# Patient Record
Sex: Female | Born: 1955 | Race: White | Hispanic: No | Marital: Single | Smoking: Never smoker
Health system: Southern US, Community
[De-identification: ages and names within clinical notes are randomized; demographics above are authoritative.]

## PROBLEM LIST (undated history)

## (undated) DIAGNOSIS — F329 Major depressive disorder, single episode, unspecified: Secondary | ICD-10-CM

## (undated) DIAGNOSIS — K5900 Constipation, unspecified: Secondary | ICD-10-CM

## (undated) DIAGNOSIS — I2510 Atherosclerotic heart disease of native coronary artery without angina pectoris: Secondary | ICD-10-CM

## (undated) DIAGNOSIS — F32A Depression, unspecified: Secondary | ICD-10-CM

## (undated) DIAGNOSIS — E785 Hyperlipidemia, unspecified: Secondary | ICD-10-CM

## (undated) HISTORY — PX: COLONOSCOPY: SHX174

## (undated) HISTORY — PX: BREAST BIOPSY: SHX20

---

## 2016-11-15 ENCOUNTER — Emergency Department (HOSPITAL_COMMUNITY)
Admission: EM | Admit: 2016-11-15 | Discharge: 2016-11-15 | Disposition: A | Discharge status: Home / Self Care | Payer: PRIVATE HEALTH INSURANCE | Attending: Emergency Medicine | Admitting: Emergency Medicine

## 2016-11-15 ENCOUNTER — Emergency Department (HOSPITAL_COMMUNITY): Payer: PRIVATE HEALTH INSURANCE

## 2016-11-15 ENCOUNTER — Encounter (HOSPITAL_COMMUNITY): Payer: Self-pay | Admitting: Emergency Medicine

## 2016-11-15 DIAGNOSIS — R0789 Other chest pain: Secondary | ICD-10-CM | POA: Diagnosis not present

## 2016-11-15 DIAGNOSIS — Z7982 Long term (current) use of aspirin: Secondary | ICD-10-CM | POA: Diagnosis not present

## 2016-11-15 DIAGNOSIS — R079 Chest pain, unspecified: Secondary | ICD-10-CM | POA: Diagnosis present

## 2016-11-15 DIAGNOSIS — I2510 Atherosclerotic heart disease of native coronary artery without angina pectoris: Secondary | ICD-10-CM | POA: Diagnosis not present

## 2016-11-15 HISTORY — DX: Depression, unspecified: F32.A

## 2016-11-15 HISTORY — DX: Major depressive disorder, single episode, unspecified: F32.9

## 2016-11-15 HISTORY — DX: Atherosclerotic heart disease of native coronary artery without angina pectoris: I25.10

## 2016-11-15 LAB — BASIC METABOLIC PANEL
Anion gap: 9 (ref 5–15)
BUN: 18 mg/dL (ref 6–20)
CO2: 24 mmol/L (ref 22–32)
CREATININE: 0.97 mg/dL (ref 0.44–1.00)
Calcium: 9.6 mg/dL (ref 8.9–10.3)
Chloride: 106 mmol/L (ref 101–111)
GFR calc Af Amer: >60 mL/min (ref >60)
GLUCOSE: 101 mg/dL — AB (ref 65–99)
Potassium: 3.7 mmol/L (ref 3.5–5.1)
SODIUM: 139 mmol/L (ref 135–145)

## 2016-11-15 LAB — CBC
HCT: 42.9 % (ref 36.0–46.0)
Hemoglobin: 14.4 g/dL (ref 12.0–15.0)
MCH: 31.4 pg (ref 26.0–34.0)
MCHC: 33.6 g/dL (ref 30.0–36.0)
MCV: 93.5 fL (ref 78.0–100.0)
PLATELETS: 241 10*3/uL (ref 150–400)
RBC: 4.59 MIL/uL (ref 3.87–5.11)
RDW: 12.6 % (ref 11.5–15.5)
WBC: 7 10*3/uL (ref 4.0–10.5)

## 2016-11-15 LAB — D-DIMER, QUANTITATIVE: D-Dimer, Quant: <0.27 ug/mL-FEU (ref 0.00–0.50)

## 2016-11-15 LAB — TROPONIN I

## 2016-11-15 LAB — I-STAT TROPONIN, ED: Troponin i, poc: 0 ng/mL (ref 0.00–0.08)

## 2016-11-15 MED ORDER — GI COCKTAIL ~~LOC~~
30.0000 mL | Freq: Once | ORAL | Status: DC
  Filled 2016-11-15: qty 30

## 2016-11-15 NOTE — ED Notes (Signed)
Pt ambulated to room from waiting room, tolerated well. 

## 2016-11-15 NOTE — ED Triage Notes (Signed)
Cp started yesterday feels like heaviness she states hurts to take a deep breath

## 2016-11-15 NOTE — ED Provider Notes (Signed)
[facility]-EMERGENCY DEPT Provider Note   CSN: [id number] Arrival date & time: 11/15/16  1345     History   Chief Complaint Chief Complaint  Patient presents with  . Chest Pain    HPI Cheryl Dunlap is a 61 y.o. female.  [age] yoF with a chief complaint of left-sided chest pain. This started when she woke up this morning. Has persisted throughout the day. Normally worse with normal activity throughout the day. Some mild shortness of breath with this. Denies diaphoresis denies vomiting. Having some radiation to her left jaw and her shoulder. Mild nausea as well as. Denies smoking denies diabetes denies hypertension. Has a history of hyperlipidemia as well as mom had an MI in her early 5950s. Denies lower extremity edema. Has been making multiple trips up to [place] over the past couple months. Return after seven-hour drive yesterday.   The history is provided by the patient.  Chest Pain   This is a new problem. The current episode started yesterday. The problem occurs constantly. The problem has not changed since onset.The pain is associated with movement and walking. The pain is present in the lateral region. The pain is at a severity of 8/10. The pain is moderate. The quality of the pain is described as pressure-like. The pain radiates to the left jaw and left arm. Duration of episode(s) is 1 day. Associated symptoms include malaise/fatigue, shortness of breath and weakness. Pertinent negatives include no dizziness, no fever, no headaches, no nausea, no palpitations and no vomiting. She has tried nothing for the symptoms. The treatment provided no relief.  Her past medical history is significant for hyperlipidemia.  Pertinent negatives for past medical history include no CAD, no hypertension, no MI, no PE and no PVD.  Her family medical history is significant for early MI.  Pertinent negatives for family medical history include: no PE.  Procedure history is negative for cardiac catheterization.     Past Medical History:  Diagnosis Date  . Coronary artery disease    corodid disection  . Depression     There are no active problems to display for this patient.   History reviewed. No pertinent surgical history.  OB History    No data available       Home Medications    Prior to Admission medications   Medication Sig Start Date End Date Taking? Authorizing Provider  aspirin EC 81 MG tablet Take 81 mg by mouth at bedtime.    Yes Historical Provider, MD  Bioflavonoid Products (GRAPE SEED PO) Take 1 capsule by mouth at bedtime.   Yes Historical Provider, MD  glucosamine-chondroitin 500-400 MG tablet Take 1 tablet by mouth at bedtime.   Yes Historical Provider, MD  Multiple Vitamins-Minerals (ONE-A-DAY WOMENS 50+ ADVANTAGE) TABS Take 1 tablet by mouth daily.   Yes Historical Provider, MD  Omega-3 Fatty Acids (FISH OIL PO) Take 1 capsule by mouth daily.   Yes Historical Provider, MD  simvastatin (ZOCOR) 10 MG tablet Take 15 mg by mouth at bedtime.   Yes Historical Provider, MD  UBIQUINOL PO Take 1 capsule by mouth daily.   Yes Historical Provider, MD    Family History No family history on file.  Social History Social History  Substance Use Topics  . Smoking status: Never Smoker  . Smokeless tobacco: Never Used  . Alcohol use Not on file     Allergies   Lincomycin and Eryc [erythromycin]   Review of Systems Review of Systems  Constitutional: Positive for malaise/fatigue. Negative  for chills and fever.  HENT: Negative for congestion and rhinorrhea.   Eyes: Negative for redness and visual disturbance.  Respiratory: Positive for shortness of breath. Negative for wheezing.   Cardiovascular: Positive for chest pain. Negative for palpitations.  Gastrointestinal: Negative for nausea and vomiting.  Genitourinary: Negative for dysuria and urgency.  Musculoskeletal: Negative for arthralgias and myalgias.  Skin: Negative for pallor and wound.  Neurological: Positive  for weakness. Negative for dizziness and headaches.     Physical Exam Updated Vital Signs BP 139/79   Pulse (!) 56   Temp 98.7 F (37.1 C) (Oral)   Resp 19   SpO2 100%   Physical Exam  Constitutional: She is oriented to person, place, and time. She appears well-developed and well-nourished. No distress.  HENT:  Head: Normocephalic and atraumatic.  Eyes: EOM are normal. Pupils are equal, round, and reactive to light.  Neck: Normal range of motion. Neck supple.  Cardiovascular: Normal rate and regular rhythm.  Exam reveals no gallop and no friction rub.   No murmur heard. Pulmonary/Chest: Effort normal. She has no wheezes. She has no rales.  Abdominal: Soft. She exhibits no distension and no mass. There is no tenderness. There is no guarding.  Musculoskeletal: She exhibits no edema or tenderness.  Neurological: She is alert and oriented to person, place, and time.  Skin: Skin is warm and dry. She is not diaphoretic.  Psychiatric: She has a normal mood and affect. Her behavior is normal.  Nursing note and vitals reviewed.    ED Treatments / Results  Labs (all labs ordered are listed, but only abnormal results are displayed) Labs Reviewed  BASIC METABOLIC PANEL - Abnormal; Notable for the following:       Result Value   Glucose, Bld 101 (*)    All other components within normal limits  CBC  TROPONIN I  D-DIMER, QUANTITATIVE (NOT AT [facility])  I-STAT TROPOININ, ED  I-STAT TROPOININ, ED    EKG  EKG Interpretation  Date/Time:  Wednesday November 15 2016 13:59:34 EST Ventricular Rate:  70 PR Interval:  200 QRS Duration: 94 QT Interval:  402 QTC Calculation: 434 R Axis:   131 Text Interpretation:  Normal sinus rhythm with sinus arrhythmia Right axis deviation Pulmonary disease pattern Abnormal ECG No old tracing to compare Confirmed by Cedar Ditullio MD, DANIEL [phone]) on 11/15/2016 3:49:23 PM       Radiology Dg Chest 2 View  Result Date: 11/15/2016 CLINICAL DATA:  Episodes  of chest heaviness and left-sided chest pain for the past month. Pain radiates seen the left jaw. No known cardiopulmonary history. Questionable history of previous carotid dissection. EXAM: CHEST  2 VIEW COMPARISON:  None in PACs FINDINGS: The lungs are well-expanded. There is no focal infiltrate. There is no pleural effusion. The heart and pulmonary vascularity are normal. The mediastinum is normal in width. In the retrosternal region there is a 3 mm diameter calcified nodule that likely reflects previous granulomatous infection. The observed bony thorax exhibits no acute abnormality. IMPRESSION: No acute cardiopulmonary disease. Electronically Signed   By: David  [place] M.D.   On: 11/15/2016 15:08    Procedures Procedures (including critical care time)  Medications Ordered in ED Medications - No data to display   Initial Impression / Assessment and Plan / ED Course  I have reviewed the triage vital signs and the nursing notes.  Pertinent labs & imaging results that were available during my care of the patient were reviewed by me and considered in  my medical decision making (see chart for details).     61 yo F With a chief complaint of chest pain. Does not sound exertional based on her history. Will obtain a delta troponin. D-dimer with recent prolonged travel.  Delta trop negative, ddimer negative.  D/c home.   11:27 PM:  I have discussed the diagnosis/risks/treatment options with the patient and family and believe the pt to be eligible for discharge home to follow-up with PCP. We also discussed returning to the ED immediately if new or worsening sx occur. We discussed the sx which are most concerning (e.g., sudden worsening pain, fever, inability to tolerate by mouth) that necessitate immediate return. Medications administered to the patient during their visit and any new prescriptions provided to the patient are listed below.  Medications given during this visit Medications - No data to  display   The patient appears reasonably screen and/or stabilized for discharge and I doubt any other medical condition or other [facility] requiring further screening, evaluation, or treatment in the ED at this time prior to discharge.    Final Clinical Impressions(s) / ED Diagnoses   Final diagnoses:  Atypical chest pain    New Prescriptions Discharge Medication List as of 11/15/2016  6:58 PM       Melene Plan, DO 11/15/16 2327

## 2016-11-15 NOTE — Discharge Instructions (Signed)
Follow up with a family doc.  Follow up with your cardiologist.  Call them and discuss your symptoms.  They may want to see you sooner, or have a stress test ordered for you. Return for worsening symptoms.

## 2016-11-16 NOTE — Progress Notes (Signed)
Nsg Discharge Note  Admit Date:  11/15/2016 Discharge date: 11/16/2016   Sherriann Godina to be D/C'd Home per MD order.  AVS completed.  Copy for chart, and copy for patient signed, and dated. Patient/caregiver able to verbalize understanding.  Discharge Medication: Allergies as of 11/15/2016      Reactions   Lincomycin Nausea And Vomiting   Eryc [erythromycin] Hives      Medication List    ASK your doctor about these medications   aspirin EC 81 MG tablet Take 81 mg by mouth at bedtime.   FISH OIL PO Take 1 capsule by mouth daily.   glucosamine-chondroitin 500-400 MG tablet Take 1 tablet by mouth at bedtime.   GRAPE SEED PO Take 1 capsule by mouth at bedtime.   ONE-A-DAY WOMENS 50+ ADVANTAGE Tabs Take 1 tablet by mouth daily.   simvastatin 10 MG tablet Commonly known as:  ZOCOR Take 15 mg by mouth at bedtime.   UBIQUINOL PO Take 1 capsule by mouth daily.       Discharge Assessment: Vitals:   11/15/16 1556 11/15/16 1900  BP: 146/82 139/79  Pulse: 60 (!) 56  Resp: 14 19  Temp:     Skin clean, dry and intact without evidence of skin break down, no evidence of skin tears noted. IV catheter discontinued intact. Site without signs and symptoms of complications - no redness or edema noted at insertion site, patient denies c/o pain - only slight tenderness at site.  Dressing with slight pressure applied.  D/c Instructions-Education: Discharge instructions given to patient/family with verbalized understanding. D/c education completed with patient/family including follow up instructions, medication list, d/c activities limitations if indicated, with other d/c instructions as indicated by MD - patient able to verbalize understanding, all questions fully answered. Patient instructed to return to ED, call 911, or call MD for any changes in condition.  Patient escorted via WC, and D/C home via private auto.  Chandi Nicklin Consuella Loselaine, RN 11/16/2016 7:45 PM

## 2016-11-22 ENCOUNTER — Ambulatory Visit: Payer: PRIVATE HEALTH INSURANCE | Admitting: Primary Care

## 2017-02-16 ENCOUNTER — Other Ambulatory Visit: Payer: Self-pay | Admitting: Student

## 2017-02-16 DIAGNOSIS — Z1239 Encounter for other screening for malignant neoplasm of breast: Secondary | ICD-10-CM

## 2017-05-08 ENCOUNTER — Ambulatory Visit
Admission: RE | Admit: 2017-05-08 | Discharge: 2017-05-08 | Disposition: A | Discharge status: Home / Self Care | Payer: PRIVATE HEALTH INSURANCE | Source: Ambulatory Visit | Attending: Student | Admitting: Student

## 2017-05-08 DIAGNOSIS — Z1231 Encounter for screening mammogram for malignant neoplasm of breast: Secondary | ICD-10-CM | POA: Insufficient documentation

## 2017-05-08 DIAGNOSIS — Z1239 Encounter for other screening for malignant neoplasm of breast: Secondary | ICD-10-CM

## 2018-06-18 ENCOUNTER — Other Ambulatory Visit: Payer: Self-pay | Admitting: Student

## 2018-06-18 DIAGNOSIS — Z1231 Encounter for screening mammogram for malignant neoplasm of breast: Secondary | ICD-10-CM

## 2018-07-11 ENCOUNTER — Ambulatory Visit
Admission: RE | Admit: 2018-07-11 | Discharge: 2018-07-11 | Disposition: A | Discharge status: Home / Self Care | Payer: 59 | Source: Ambulatory Visit | Attending: Student | Admitting: Student

## 2018-07-11 DIAGNOSIS — Z1231 Encounter for screening mammogram for malignant neoplasm of breast: Secondary | ICD-10-CM | POA: Diagnosis present

## 2019-05-28 ENCOUNTER — Other Ambulatory Visit: Payer: Self-pay | Admitting: Student

## 2019-05-28 DIAGNOSIS — Z1231 Encounter for screening mammogram for malignant neoplasm of breast: Secondary | ICD-10-CM

## 2019-07-17 ENCOUNTER — Ambulatory Visit
Admission: RE | Admit: 2019-07-17 | Discharge: 2019-07-17 | Disposition: A | Discharge status: Home / Self Care | Payer: 59 | Source: Ambulatory Visit | Attending: Student | Admitting: Student

## 2019-07-17 DIAGNOSIS — Z1231 Encounter for screening mammogram for malignant neoplasm of breast: Secondary | ICD-10-CM | POA: Diagnosis present

## 2020-04-02 ENCOUNTER — Other Ambulatory Visit: Payer: Self-pay

## 2020-04-02 ENCOUNTER — Emergency Department: Payer: 59

## 2020-04-02 DIAGNOSIS — I2510 Atherosclerotic heart disease of native coronary artery without angina pectoris: Secondary | ICD-10-CM | POA: Diagnosis not present

## 2020-04-02 DIAGNOSIS — I10 Essential (primary) hypertension: Secondary | ICD-10-CM | POA: Insufficient documentation

## 2020-04-02 DIAGNOSIS — Z7982 Long term (current) use of aspirin: Secondary | ICD-10-CM | POA: Insufficient documentation

## 2020-04-02 DIAGNOSIS — R0789 Other chest pain: Secondary | ICD-10-CM | POA: Diagnosis present

## 2020-04-02 LAB — BASIC METABOLIC PANEL
Anion gap: 10 (ref 5–15)
BUN: 22 mg/dL (ref 8–23)
CO2: 24 mmol/L (ref 22–32)
Calcium: 9.4 mg/dL (ref 8.9–10.3)
Chloride: 103 mmol/L (ref 98–111)
Creatinine, Ser: 0.86 mg/dL (ref 0.44–1.00)
GFR calc Af Amer: >60 mL/min (ref >60)
GFR calc non Af Amer: >60 mL/min (ref >60)
Glucose, Bld: 105 mg/dL — ABNORMAL HIGH (ref 70–99)
Potassium: 3.4 mmol/L — ABNORMAL LOW (ref 3.5–5.1)
Sodium: 137 mmol/L (ref 135–145)

## 2020-04-02 LAB — CBC
HCT: 44.3 % (ref 36.0–46.0)
Hemoglobin: 15.3 g/dL — ABNORMAL HIGH (ref 12.0–15.0)
MCH: 31 pg (ref 26.0–34.0)
MCHC: 34.5 g/dL (ref 30.0–36.0)
MCV: 89.9 fL (ref 80.0–100.0)
Platelets: 277 10*3/uL (ref 150–400)
RBC: 4.93 MIL/uL (ref 3.87–5.11)
RDW: 12.6 % (ref 11.5–15.5)
WBC: 8.8 10*3/uL (ref 4.0–10.5)
nRBC: 0 % (ref 0.0–0.2)

## 2020-04-02 LAB — TROPONIN I (HIGH SENSITIVITY): Troponin I (High Sensitivity): 5 ng/L (ref <18)

## 2020-04-02 NOTE — ED Triage Notes (Signed)
Patient c/o medial chest pain radiating into neck and jaw beginning tonight. Patient c/o headache beginning today.

## 2020-04-03 ENCOUNTER — Emergency Department
Admission: EM | Admit: 2020-04-03 | Discharge: 2020-04-03 | Disposition: A | Discharge status: Home / Self Care | Payer: 59 | Attending: Emergency Medicine | Admitting: Emergency Medicine

## 2020-04-03 ENCOUNTER — Encounter: Payer: Self-pay | Admitting: Emergency Medicine

## 2020-04-03 DIAGNOSIS — R0789 Other chest pain: Secondary | ICD-10-CM

## 2020-04-03 DIAGNOSIS — I10 Essential (primary) hypertension: Secondary | ICD-10-CM

## 2020-04-03 HISTORY — DX: Hyperlipidemia, unspecified: E78.5

## 2020-04-03 LAB — TROPONIN I (HIGH SENSITIVITY): Troponin I (High Sensitivity): 4 ng/L (ref <18)

## 2020-04-03 NOTE — Discharge Instructions (Addendum)
You have been seen in the Emergency Department (ED) today for chest pressure.  As we have discussed today's test results are normal, but you may require further testing.  You will also need to follow-up as an outpatient to determine if in fact you do have persistent hypertension and would benefit from treatment, or if your elevated blood pressure tonight is more results of the symptoms and anxiety (rather than the symptoms and anxiety being a result of the blood pressure).  Please follow up with the recommended doctor as instructed above in these documents regarding today's emergent visit and your recent symptoms to discuss further management.  Continue to take your regular medications. If you are not doing so already, please also take a daily baby aspirin (81 mg), at least until you follow up with your doctor.  Return to the Emergency Department (ED) if you experience any further chest pain/pressure/tightness, difficulty breathing, or sudden sweating, or other symptoms that concern you.

## 2020-04-03 NOTE — ED Provider Notes (Signed)
[facility] Emergency Department Provider Note  ____________________________________________   First MD Initiated Contact with Patient 04/03/20 [phone]     (approximate)  I have reviewed the triage vital signs and the nursing notes.   HISTORY  Chief Complaint Chest Pain    HPI Cheryl Dunlap is a 64 y.o. female with medical history as listed below who presents for evaluation of chest pressure.  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  She says she has been stressed out and has been very tired over the last few days.  She went into work today and has felt some relatively constant mild chest pressure.  She said it feels better now "because I am not as stressed" but she still feels a little bit.  She has had no sharp pain.  She has felt a warmth going up into her neck but no pain or pressure.  She had a brief episode of lightheadedness when she stood up earlier today but that has resolved.  She denies fever/chills, sore throat, shortness of breath, nausea, vomiting, and abdominal pain.  Nothing particular makes her symptoms better or worse.  She had some pain in her left upper chest or shoulder about 3 years ago for which she followed up with Dr. Lady Gary with cardiology and it was determined that her issue was musculoskeletal with the left shoulder and has since resolved.         Past Medical History:  Diagnosis Date  . Coronary artery disease    corodid disection  . Depression   . Hyperlipidemia     There are no problems to display for this patient.   Past Surgical History:  Procedure Laterality Date  . BREAST BIOPSY Right 10 plus yrs   benign    Prior to Admission medications   Medication Sig Start Date End Date Taking? Authorizing Provider  aspirin EC 81 MG tablet Take 81 mg by mouth at bedtime.     [provider]  Bioflavonoid Products (GRAPE SEED PO) Take 1 capsule by  mouth at bedtime.    [provider]  glucosamine-chondroitin 500-400 MG tablet Take 1 tablet by mouth at bedtime.    [provider]  Multiple Vitamins-Minerals (ONE-A-DAY WOMENS 50+ ADVANTAGE) TABS Take 1 tablet by mouth daily.    [provider]  Omega-3 Fatty Acids (FISH OIL PO) Take 1 capsule by mouth daily.    [provider]  simvastatin (ZOCOR) 10 MG tablet Take 15 mg by mouth at bedtime.    [provider]  UBIQUINOL PO Take 1 capsule by mouth daily.    [provider]    Allergies Lincomycin and Eryc [erythromycin]  Family History  Problem Relation Age of Onset  . Breast cancer Mother        pt not sure of age  . Breast cancer Maternal Grandmother        pt not sure of age    Social History Social History   Tobacco Use  . Smoking status: Never Smoker  . Smokeless tobacco: Never Used  Substance Use Topics  . Alcohol use: Never  . Drug use: Not on file    Review of Systems Constitutional: No fever/chills Eyes: No visual changes. ENT: No sore throat. Cardiovascular: Chest pressure as described above. Respiratory: Denies shortness of breath. Gastrointestinal: No abdominal pain.  No nausea, no vomiting.  No diarrhea.  No constipation. Genitourinary: Negative  for dysuria. Musculoskeletal: Negative for neck pain.  Negative for back pain. Integumentary: Negative for rash. Neurological: Negative for headaches, focal weakness or numbness.   ____________________________________________   PHYSICAL EXAM:  VITAL SIGNS: ED Triage Vitals  Enc Vitals Group     BP 04/02/20 2253 (!) 161/93     Pulse Rate 04/02/20 2253 70     Resp 04/02/20 2253 18     Temp 04/02/20 2253 97.8 F (36.6 C)     Temp src --      SpO2 04/02/20 2253 100 %     Weight 04/02/20 2254 86.2 kg (190 lb)     Height 04/02/20 2254 1.676 m (5\' 6" )     Head Circumference --      Peak Flow --      Pain Score 04/02/20 2253 5     Pain Loc --       Pain Edu? --      Excl. in GC? --     Constitutional: Alert and oriented.  Generally well-appearing and in no distress. Eyes: Conjunctivae are normal.  Head: Atraumatic. Nose: No congestion/rhinnorhea. Mouth/Throat: Patient is wearing a mask. Neck: No stridor.  No meningeal signs.   Cardiovascular: Normal rate, regular rhythm. Good peripheral circulation. Grossly normal heart sounds. Respiratory: Normal respiratory effort.  No retractions. Gastrointestinal: Soft and nontender. No distention.  Musculoskeletal: No lower extremity tenderness nor edema. No gross deformities of extremities. Neurologic:  Normal speech and language. No gross focal neurologic deficits are appreciated.  Skin:  Skin is warm, dry and intact. Psychiatric: Mood and affect are normal. Speech and behavior are normal.  ____________________________________________   LABS (all labs ordered are listed, but only abnormal results are displayed)  Labs Reviewed  BASIC METABOLIC PANEL - Abnormal; Notable for the following components:      Result Value   Potassium 3.4 (*)    Glucose, Bld 105 (*)    All other components within normal limits  CBC - Abnormal; Notable for the following components:   Hemoglobin 15.3 (*)    All other components within normal limits  TROPONIN I (HIGH SENSITIVITY)  TROPONIN I (HIGH SENSITIVITY)   ____________________________________________  EKG  ED ECG REPORT I, 06/03/20, the attending physician, personally viewed and interpreted this ECG.  Date: 04/02/2020 EKG Time: 22: 45 Rate: 71 Rhythm: normal sinus rhythm QRS Axis: Right axis deviation Intervals: normal ST/T Wave abnormalities: normal Narrative Interpretation: no evidence of acute ischemia  ____________________________________________  RADIOLOGY I, 06/03/2020, personally viewed and evaluated these images (plain radiographs) as part of my medical decision making, as well as reviewing the written report by the  radiologist.  ED MD interpretation: No acute abnormalities identified  Official radiology report(s): DG Chest 2 View  Result Date: 04/02/2020 CLINICAL DATA:  Chest pain EXAM: CHEST - 2 VIEW COMPARISON:  11/15/2016 FINDINGS: The heart size and mediastinal contours are within normal limits. Both lungs are clear. The visualized skeletal structures are unremarkable. IMPRESSION: No active cardiopulmonary disease. Electronically Signed   By: 11/17/2016 M.D.   On: 04/02/2020 23:37    ____________________________________________   PROCEDURES   Procedure(s) performed (including Critical Care):  Procedures   ____________________________________________   INITIAL IMPRESSION / MDM / ASSESSMENT AND PLAN / ED COURSE  As part of my medical decision making, I reviewed the following data within the electronic MEDICAL RECORD NUMBER Nursing notes reviewed and incorporated, Labs reviewed , EKG interpreted , Old chart reviewed, Radiograph reviewed  and Notes from prior ED  visits   Differential diagnosis includes, but is not limited to, stress/anxiety, hypertension, ACS, PE, pneumonia, electrolyte or metabolic abnormality.  EKG is reassuring.  Vital signs are all reassuring except for hypertension for which the patient is not treated and has no prior diagnosis, but it is unclear if this is the source of her symptoms are simply resulting from the fact of her anxiety and her symptoms.  Her CBC, basic metabolic panel, and initial high-sensitivity troponin are all within normal limits.  Chest x-ray was reviewed by me personally and shows no sign of acute abnormality.  Wells Score for PE is zero.  Low risk for ACS based on HEAR score.  We discussed her history of present illness and all of her results.  Assuming her second high-sensitivity troponin is unchanged, I believe she is appropriate for discharge and outpatient follow-up with Dr. Lady Gary that she agrees with the plan.  She will also follow-up as an  outpatient regarding the possibility of a new diagnosis of hypertension but we will not treated acutely at this time.  She understands and agrees with the plan and is comfortable with discharge with a second appropriate troponin.     Clinical Course as of Apr 04 347  Sat Apr 03, 2020  3300 Appropriate second troponin, discharging as per previously discussed plan.  Troponin I (High Sensitivity): 4 [CF]    Clinical Course User Index [CF] Loleta Rose, MD     ____________________________________________  FINAL CLINICAL IMPRESSION(S) / ED DIAGNOSES  Final diagnoses:  Chest pressure  Hypertension, unspecified type     MEDICATIONS GIVEN DURING THIS VISIT:  Medications - No data to display   ED Discharge Orders    None      *Please note:  Milagros Nephew was evaluated in Emergency Department on 04/03/2020 for the symptoms described in the history of present illness. She was evaluated in the context of the global COVID-19 pandemic, which necessitated consideration that the patient might be at risk for infection with the SARS-CoV-2 virus that causes COVID-19. Institutional protocols and algorithms that pertain to the evaluation of patients at risk for COVID-19 are in a state of rapid change based on information released by regulatory bodies including the CDC and federal and state organizations. These policies and algorithms were followed during the patient's care in the ED.  Some ED evaluations and interventions may be delayed as a result of limited staffing during and after the pandemic.*  Note:  This document was prepared using Dragon voice recognition software and may include unintentional dictation errors.   Loleta Rose, MD 04/03/20 [phone]

## 2020-04-13 ENCOUNTER — Other Ambulatory Visit: Payer: Self-pay | Admitting: Student

## 2020-04-13 DIAGNOSIS — Z1231 Encounter for screening mammogram for malignant neoplasm of breast: Secondary | ICD-10-CM

## 2020-07-20 ENCOUNTER — Other Ambulatory Visit: Payer: Self-pay

## 2020-07-20 ENCOUNTER — Ambulatory Visit
Admission: RE | Admit: 2020-07-20 | Discharge: 2020-07-20 | Disposition: A | Discharge status: Home / Self Care | Payer: PRIVATE HEALTH INSURANCE | Source: Ambulatory Visit | Attending: Student | Admitting: Student

## 2020-07-20 DIAGNOSIS — Z1231 Encounter for screening mammogram for malignant neoplasm of breast: Secondary | ICD-10-CM

## 2020-09-20 ENCOUNTER — Other Ambulatory Visit
Admission: RE | Admit: 2020-09-20 | Discharge: 2020-09-20 | Disposition: A | Discharge status: Home / Self Care | Payer: 59 | Source: Ambulatory Visit | Attending: Internal Medicine | Admitting: Internal Medicine

## 2020-09-20 ENCOUNTER — Other Ambulatory Visit: Payer: Self-pay

## 2020-09-20 DIAGNOSIS — Z01812 Encounter for preprocedural laboratory examination: Secondary | ICD-10-CM | POA: Diagnosis not present

## 2020-09-20 DIAGNOSIS — Z20822 Contact with and (suspected) exposure to covid-19: Secondary | ICD-10-CM | POA: Diagnosis not present

## 2020-09-21 ENCOUNTER — Encounter: Payer: Self-pay | Admitting: Internal Medicine

## 2020-09-21 LAB — SARS CORONAVIRUS 2 (TAT 6-24 HRS): SARS Coronavirus 2: NEGATIVE

## 2020-09-22 ENCOUNTER — Ambulatory Visit
Admission: RE | Admit: 2020-09-22 | Discharge: 2020-09-22 | Disposition: A | Discharge status: Home / Self Care | Payer: 59 | Attending: Internal Medicine | Admitting: Internal Medicine

## 2020-09-22 ENCOUNTER — Ambulatory Visit: Payer: 59 | Admitting: Anesthesiology

## 2020-09-22 ENCOUNTER — Other Ambulatory Visit: Payer: Self-pay

## 2020-09-22 ENCOUNTER — Encounter: Payer: Self-pay | Admitting: Internal Medicine

## 2020-09-22 ENCOUNTER — Encounter
Admission: RE | Disposition: A | Discharge status: Home / Self Care | Payer: Self-pay | Source: Home / Self Care | Attending: Internal Medicine

## 2020-09-22 DIAGNOSIS — Z7982 Long term (current) use of aspirin: Secondary | ICD-10-CM | POA: Diagnosis not present

## 2020-09-22 DIAGNOSIS — Z79899 Other long term (current) drug therapy: Secondary | ICD-10-CM | POA: Insufficient documentation

## 2020-09-22 DIAGNOSIS — K5730 Diverticulosis of large intestine without perforation or abscess without bleeding: Secondary | ICD-10-CM | POA: Diagnosis not present

## 2020-09-22 DIAGNOSIS — Z1211 Encounter for screening for malignant neoplasm of colon: Secondary | ICD-10-CM | POA: Diagnosis present

## 2020-09-22 DIAGNOSIS — K640 First degree hemorrhoids: Secondary | ICD-10-CM | POA: Insufficient documentation

## 2020-09-22 DIAGNOSIS — Z882 Allergy status to sulfonamides status: Secondary | ICD-10-CM | POA: Diagnosis not present

## 2020-09-22 DIAGNOSIS — Z86010 Personal history of colonic polyps: Secondary | ICD-10-CM | POA: Diagnosis not present

## 2020-09-22 DIAGNOSIS — Z881 Allergy status to other antibiotic agents status: Secondary | ICD-10-CM | POA: Insufficient documentation

## 2020-09-22 DIAGNOSIS — I2510 Atherosclerotic heart disease of native coronary artery without angina pectoris: Secondary | ICD-10-CM | POA: Diagnosis not present

## 2020-09-22 DIAGNOSIS — K5520 Angiodysplasia of colon without hemorrhage: Secondary | ICD-10-CM | POA: Diagnosis not present

## 2020-09-22 HISTORY — DX: Constipation, unspecified: K59.00

## 2020-09-22 HISTORY — PX: COLONOSCOPY WITH PROPOFOL: SHX5780

## 2020-09-22 SURGERY — COLONOSCOPY WITH PROPOFOL
Anesthesia: General

## 2020-09-22 MED ORDER — PROPOFOL 10 MG/ML IV BOLUS
INTRAVENOUS | Status: DC | PRN
Start: 2020-09-22 — End: 2020-09-22
  Administered 2020-09-22: 40 mg via INTRAVENOUS

## 2020-09-22 MED ORDER — PROPOFOL 500 MG/50ML IV EMUL
INTRAVENOUS | Status: DC | PRN
  Administered 2020-09-22: 75 ug/kg/min via INTRAVENOUS

## 2020-09-22 MED ORDER — SODIUM CHLORIDE 0.9 % IV SOLN: INTRAVENOUS | Status: DC

## 2020-09-22 MED ORDER — LIDOCAINE HCL (CARDIAC) PF 100 MG/5ML IV SOSY
PREFILLED_SYRINGE | INTRAVENOUS | Status: DC | PRN
  Administered 2020-09-22: 80 mg via INTRAVENOUS

## 2020-09-22 MED ORDER — DEXMEDETOMIDINE HCL 200 MCG/2ML IV SOLN
INTRAVENOUS | Status: DC | PRN
  Administered 2020-09-22: 16 ug via INTRAVENOUS

## 2020-09-22 NOTE — Anesthesia Postprocedure Evaluation (Signed)
Anesthesia Post Note  Patient: Cheryl Dunlap  Procedure(s) Performed: COLONOSCOPY WITH PROPOFOL (N/A )  Patient location during evaluation: Endoscopy Anesthesia Type: General Level of consciousness: awake and alert Pain management: pain level controlled Vital Signs Assessment: post-procedure vital signs reviewed and stable Respiratory status: spontaneous breathing and respiratory function stable Cardiovascular status: stable Anesthetic complications: no   No complications documented.   Last Vitals:  Vitals:   09/22/20 1616 09/22/20 1618  BP: (!) 133/121 133/69  Pulse: (!) 54 (!) 51  Resp: 14 12  Temp:    SpO2: 100% 99%    Last Pain:  Vitals:   09/22/20 1618  TempSrc:   PainSc: 0-No pain                 Theadore Blunck K

## 2020-09-22 NOTE — Transfer of Care (Signed)
Immediate Anesthesia Transfer of Care Note  Patient: Cheryl Dunlap  Procedure(s) Performed: COLONOSCOPY WITH PROPOFOL (N/A )  Patient Location: PACU on Endoscopy Unit  Anesthesia Type:General  Level of Consciousness: drowsy  Airway & Oxygen Therapy: Patient Spontanous Breathing  Post-op Assessment: Report given to RN and Post -op Vital signs reviewed and stable  Post vital signs: Reviewed and stable  Last Vitals:  Vitals Value Taken Time  BP 121/71 09/22/20 1546  Temp    Pulse 59 09/22/20 1546  Resp 24 09/22/20 1546  SpO2 95 % 09/22/20 1546  Vitals shown include unvalidated device data.  Last Pain:  Vitals:   09/22/20 1333  TempSrc: Temporal         Complications: No complications documented.

## 2020-09-22 NOTE — Interval H&P Note (Signed)
History and Physical Interval Note:  09/22/2020 3:26 PM  Cheryl Dunlap  has presented today for surgery, with the diagnosis of HX COLON POLYPS.  The various methods of treatment have been discussed with the patient and family. After consideration of risks, benefits and other options for treatment, the patient has consented to  Procedure(s): COLONOSCOPY WITH PROPOFOL (N/A) as a surgical intervention.  The patient's history has been reviewed, patient examined, no change in status, stable for surgery.  I have reviewed the patient's chart and labs.  Questions were answered to the patient's satisfaction.     [place], [place]

## 2020-09-22 NOTE — Anesthesia Preprocedure Evaluation (Signed)
Anesthesia Evaluation  Patient identified by MRN, date of birth, ID band Patient awake    Reviewed: Allergy & Precautions, NPO status , Patient's Chart, lab work & pertinent test results  Airway Mallampati: III       Dental  (+) Teeth Intact   Pulmonary neg pulmonary ROS,    Pulmonary exam normal        Cardiovascular + CAD  Normal cardiovascular exam     Neuro/Psych PSYCHIATRIC DISORDERS Depression negative neurological ROS     GI/Hepatic negative GI ROS, Neg liver ROS,   Endo/Other  negative endocrine ROS  Renal/GU negative Renal ROS  negative genitourinary   Musculoskeletal negative musculoskeletal ROS (+)   Abdominal Normal abdominal exam  (+)   Peds negative pediatric ROS (+)  Hematology negative hematology ROS (+)   Anesthesia Other Findings Past Medical History: No date: Constipation No date: Coronary artery disease     Comment:  carotid dissection No date: Depression No date: Hyperlipidemia  Reproductive/Obstetrics                             Anesthesia Physical Anesthesia Plan  ASA: III  Anesthesia Plan: General   Post-op Pain Management:    Induction: Intravenous  PONV Risk Score and Plan:   Airway Management Planned: Nasal Cannula  Additional Equipment:   Intra-op Plan:   Post-operative Plan:   Informed Consent: I have reviewed the patients History and Physical, chart, labs and discussed the procedure including the risks, benefits and alternatives for the proposed anesthesia with the patient or authorized representative who has indicated his/her understanding and acceptance.     Dental advisory given  Plan Discussed with: CRNA and Surgeon  Anesthesia Plan Comments:         Anesthesia Quick Evaluation

## 2020-09-22 NOTE — Op Note (Signed)
[facility] Gastroenterology Patient Name: Cheryl Dunlap Procedure Date: 09/22/2020 3:15 PM MRN: [id number] Account #: [id number] Date of Birth: Oct 25, 1955 Admit Type: Outpatient Age: 64 Room: [facility] ENDO ROOM 2 Gender: Female Note Status: Finalized Procedure:             Colonoscopy Indications:           High risk colon cancer surveillance: Personal history                         of colonic polyps Providers:             Boykin Nearing. Norma Fredrickson MD, MD Referring MD:          No Local Md, MD (Referring MD) Medicines:             Propofol per Anesthesia Complications:         No immediate complications. Procedure:             Pre-Anesthesia Assessment:                        - The risks and benefits of the procedure and the                         sedation options and risks were discussed with the                         patient. All questions were answered and informed                         consent was obtained.                        - Patient identification and proposed procedure were                         verified prior to the procedure by the nurse. The                         procedure was verified in the procedure room.                        - ASA Grade Assessment: III - A patient with severe                         systemic disease.                        - After reviewing the risks and benefits, the patient                         was deemed in satisfactory condition to undergo the                         procedure.                        After obtaining informed consent, the colonoscope was                         passed under direct vision. Throughout the  procedure,                         the patient's blood pressure, pulse, and oxygen                         saturations were monitored continuously. The                         Colonoscope was introduced through the anus and                         advanced to the the cecum, identified by appendiceal                          orifice and ileocecal valve. The colonoscopy was                         performed without difficulty. The patient tolerated                         the procedure well. The quality of the bowel                         preparation was good. The ileocecal valve, appendiceal                         orifice, and rectum were photographed. Findings:      The perianal and digital rectal examinations were normal. Pertinent       negatives include normal sphincter tone and no palpable rectal lesions.      A few small-mouthed diverticula were found in the sigmoid colon and       transverse colon.      Two medium-sized localized angioectasias without bleeding were found in       the sigmoid colon and in the cecum.      Non-bleeding internal hemorrhoids were found during retroflexion. The       hemorrhoids were Grade I (internal hemorrhoids that do not prolapse).      The exam was otherwise without abnormality. Impression:            - Diverticulosis in the sigmoid colon and in the                         transverse colon.                        - Two non-bleeding colonic angioectasias.                        - The examination was otherwise normal.                        - No specimens collected. Recommendation:        - Patient has a contact number available for                         emergencies. The signs and symptoms of potential  delayed complications were discussed with the patient.                         Return to normal activities tomorrow. Written                         discharge instructions were provided to the patient.                        - Resume previous diet.                        - Continue present medications.                        - Repeat colonoscopy in 10 years for screening                         purposes.                        - Return to GI office PRN.                        - The findings and recommendations were discussed  with                         the patient. Procedure Code(s):     --- Professional ---                        Q8250, Colorectal cancer screening; colonoscopy on                         individual at high risk Diagnosis Code(s):     --- Professional ---                        K57.30, Diverticulosis of large intestine without                         perforation or abscess without bleeding                        K55.20, Angiodysplasia of colon without hemorrhage                        Z86.010, Personal history of colonic polyps CPT copyright 2019 American Medical Association. All rights reserved. The codes documented in this report are preliminary and upon coder review may  be revised to meet current compliance requirements. Stanton Kidney MD, MD 09/22/2020 3:47:03 PM This report has been signed electronically. Number of Addenda: 0 Note Initiated On: 09/22/2020 3:15 PM Scope Withdrawal Time: 0 hours 6 minutes 8 seconds  Total Procedure Duration: 0 hours 10 minutes 19 seconds  Estimated Blood Loss:  Estimated blood loss: none. Estimated blood loss:                         none. Estimated blood loss: none.      [facility]

## 2020-09-22 NOTE — H&P (Signed)
Outpatient [place] stay form Pre-procedure 09/22/2020 3:25 PM Jerilee Space K. Norma Fredrickson, M.D.  Primary Physician: Carren Rang, PA-C  Reason for visit:  Personal history of colon polyps  History of present illness:                           Patient presents for colonoscopy for a personal hx of colon polyps. The patient denies abdominal pain, abnormal weight loss or rectal bleeding.      Current Facility-Administered Medications:  .  0.9 %  sodium chloride infusion, , Intravenous, Continuous, [place], Boykin Nearing, MD, Last Rate: 20 mL/hr at 09/22/20 1357, New Bag at 09/22/20 1357  Medications Prior to Admission  Medication Sig Dispense Refill Last Dose  . glucosamine-chondroitin 500-400 MG tablet Take 1 tablet by mouth at bedtime.   Past Week at Unknown time  . Multiple Vitamins-Minerals (ONE-A-DAY WOMENS 50+ ADVANTAGE) TABS Take 1 tablet by mouth daily.   Past Week at Unknown time  . Omega-3 Fatty Acids (FISH OIL PO) Take 1 capsule by mouth daily.   Past Week at Unknown time  . Probiotic Product (ALIGN PO) Take 1 capsule by mouth daily.   Past Week at Unknown time  . saccharomyces boulardii (FLORASTOR) 250 MG capsule Take 250 mg by mouth daily.   Past Week at Unknown time  . simvastatin (ZOCOR) 10 MG tablet Take 15 mg by mouth at bedtime.   09/21/2020 at Unknown time  . UBIQUINOL PO Take 1 capsule by mouth daily.   Past Week at Unknown time  . aspirin EC 81 MG tablet Take 81 mg by mouth at bedtime.  (Patient not taking: Reported on 09/22/2020)   Completed Course at Unknown time  . Bioflavonoid Products (GRAPE SEED PO) Take 1 capsule by mouth at bedtime.        Allergies  Allergen Reactions  . Eryc [Erythromycin] Hives  . Lincomycin Nausea And Vomiting  . Sulfa Antibiotics Hives     Past Medical History:  Diagnosis Date  . Constipation   . Coronary artery disease    carotid dissection  . Depression   . Hyperlipidemia     Review of systems:  Otherwise negative.    Physical  Exam  Gen: Alert, oriented. Appears stated age.  HEENT: [place]/AT. PERRLA. Lungs: CTA, no wheezes. CV: RR nl S1, S2. Abd: soft, benign, no masses. BS+ Ext: No edema. Pulses 2+    Planned procedures: Proceed with colonoscopy. The patient understands the nature of the planned procedure, indications, risks, alternatives and potential complications including but not limited to bleeding, infection, perforation, damage to internal organs and possible oversedation/side effects from anesthesia. The patient agrees and gives consent to proceed.  Please refer to procedure notes for findings, recommendations and patient disposition/instructions.     Harrell Niehoff K. Norma Fredrickson, M.D. Gastroenterology 09/22/2020  3:25 PM

## 2020-09-23 ENCOUNTER — Encounter: Payer: Self-pay | Admitting: Internal Medicine

## 2021-03-20 ENCOUNTER — Other Ambulatory Visit: Payer: Self-pay

## 2021-03-20 ENCOUNTER — Emergency Department: Payer: No Typology Code available for payment source

## 2021-03-20 ENCOUNTER — Emergency Department
Admission: EM | Admit: 2021-03-20 | Discharge: 2021-03-21 | Disposition: A | Discharge status: Home / Self Care | Payer: No Typology Code available for payment source | Attending: Student | Admitting: Student

## 2021-03-20 DIAGNOSIS — Y990 Civilian activity done for income or pay: Secondary | ICD-10-CM | POA: Insufficient documentation

## 2021-03-20 DIAGNOSIS — I2510 Atherosclerotic heart disease of native coronary artery without angina pectoris: Secondary | ICD-10-CM | POA: Diagnosis not present

## 2021-03-20 DIAGNOSIS — M542 Cervicalgia: Secondary | ICD-10-CM | POA: Diagnosis not present

## 2021-03-20 DIAGNOSIS — S0990XA Unspecified injury of head, initial encounter: Secondary | ICD-10-CM | POA: Insufficient documentation

## 2021-03-20 DIAGNOSIS — W228XXA Striking against or struck by other objects, initial encounter: Secondary | ICD-10-CM | POA: Diagnosis not present

## 2021-03-20 DIAGNOSIS — Z7982 Long term (current) use of aspirin: Secondary | ICD-10-CM | POA: Insufficient documentation

## 2021-03-20 MED ORDER — ACETAMINOPHEN 500 MG PO TABS
500.0000 mg | ORAL_TABLET | Freq: Once | ORAL | Status: AC
  Administered 2021-03-20: 500 mg via ORAL
  Filled 2021-03-20: qty 1

## 2021-03-20 NOTE — ED Provider Notes (Signed)
[facility] Emergency Department Provider Note  ____________________________________________   None    (approximate)  I have reviewed the triage vital signs and the nursing notes.   HISTORY  Chief Complaint Head Injury   HPI Cheryl Dunlap is a 65 y.o. female who reports to the ER for evaluation of head injury with neck pain that occurred at work. Patient states she was struck in the head from a printer and boxes from above. As they fell, she reports falling backwards but is unsure if she struck her head in the back. She reports immediate neck pain at time of accident as well. Denies any other pain. 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 She states she thinks the floater are related her to glasses being dirty.        Past Medical History:  Diagnosis Date   Constipation    Coronary artery disease    carotid dissection   Depression    Hyperlipidemia     There are no problems to display for this patient.   Past Surgical History:  Procedure Laterality Date   BREAST BIOPSY Right 10 plus yrs   benign   COLONOSCOPY     COLONOSCOPY WITH PROPOFOL N/A 09/22/2020   Procedure: COLONOSCOPY WITH PROPOFOL;  Surgeon: [place], Boykin Nearing, MD;  Location: [facility] ENDOSCOPY;  Service: Gastroenterology;  Laterality: N/A;    Prior to Admission medications   Medication Sig Start Date End Date Taking? Authorizing Provider  aspirin EC 81 MG tablet Take 81 mg by mouth at bedtime.  Patient not taking: Reported on 09/22/2020    [provider]  Bioflavonoid Products (GRAPE SEED PO) Take 1 capsule by mouth at bedtime.    [provider]  glucosamine-chondroitin 500-400 MG tablet Take 1 tablet by mouth at bedtime.    [provider]  Multiple Vitamins-Minerals (ONE-A-DAY WOMENS 50+ ADVANTAGE) TABS Take 1 tablet by mouth daily.    [provider]  Omega-3 Fatty  Acids (FISH OIL PO) Take 1 capsule by mouth daily.    [provider]  Probiotic Product (ALIGN PO) Take 1 capsule by mouth daily.    [provider]  saccharomyces boulardii (FLORASTOR) 250 MG capsule Take 250 mg by mouth daily.    [provider]  simvastatin (ZOCOR) 10 MG tablet Take 15 mg by mouth at bedtime.    [provider]  UBIQUINOL PO Take 1 capsule by mouth daily.    [provider]    Allergies Eryc [erythromycin], Lincomycin, and Sulfa antibiotics  Family History  Problem Relation Age of Onset   Breast cancer Mother        pt not sure of age   Breast cancer Maternal Grandmother        pt not sure of age    Social History Social History   Tobacco Use   Smoking status: Never   Smokeless tobacco: Never  Substance Use Topics   Alcohol use: Never   Drug use: Never    Review of Systems Constitutional: No fever/chills Eyes: No visual changes. ENT: No sore throat. Cardiovascular: Denies chest pain. Respiratory: Denies shortness of breath. Gastrointestinal: No abdominal pain.  No nausea, no vomiting.  No diarrhea.  No constipation. Genitourinary: Negative for dysuria. Musculoskeletal: + neck pain, Negative for back pain. Skin: Negative for rash. Neurological: + headaches, negative for focal weakness or numbness.  ____________________________________________   PHYSICAL EXAM:  VITAL SIGNS: ED Triage Vitals  Enc Vitals Group     BP 03/20/21 2204 (!) 198/96     Pulse Rate 03/20/21 2203 62     Resp 03/20/21 2203 16     Temp --      Temp src --      SpO2 03/20/21 2203 100 %     Weight 03/20/21 2203 198 lb (89.8 kg)     Height 03/20/21 2203 5\' 6"  (1.676 m)     Head Circumference --      Peak Flow --      Pain Score 03/20/21 2203 7     Pain Loc --      Pain Edu? --      Excl. in GC? --    Constitutional: Alert and oriented. Well appearing and in no acute distress. Eyes: Conjunctivae are normal. PERRL. EOMI.  Optic fundus visualized with no obvious abnormalities bilaterally Head: Atraumatic. Nose: No congestion/rhinnorhea. Mouth/Throat: Mucous membranes are moist.  Oropharynx non-erythematous. Neck: No stridor. Midline tenderness noted with right sided tenderness. No left sided tenderness. Full ROM. Cardiovascular: Normal rate, regular rhythm. Grossly normal heart sounds.  Good peripheral circulation. Respiratory: Normal respiratory effort.  No retractions. Lungs CTAB. Gastrointestinal: Soft and nontender. No distention. No abdominal bruits. No CVA tenderness. Musculoskeletal: No lower extremity tenderness nor edema.  No joint effusions. Neurologic:  Normal speech and language. CNII-XII grossly intact. No drift. No gross focal neurologic deficits are appreciated. No gait instability. Skin:  Skin is warm, dry and intact. No rash noted. Psychiatric: Mood and affect are normal. Speech and behavior are normal.  ____________________________________________  RADIOLOGY  Official radiology report(s): CT Head Wo Contrast  Result Date: 03/20/2021 CLINICAL DATA:  Struck by falling computer monitor EXAM: CT HEAD WITHOUT CONTRAST CT CERVICAL SPINE WITHOUT CONTRAST TECHNIQUE: Multidetector CT imaging of the head and cervical spine was performed following the standard protocol without intravenous contrast. Multiplanar CT image reconstructions of the cervical spine were also generated. COMPARISON:  None. FINDINGS: CT HEAD FINDINGS Brain: No evidence of acute infarction, hemorrhage, hydrocephalus, extra-axial collection, visible mass lesion or mass effect. Vascular: Atherosclerotic calcification of the carotid siphons. No hyperdense vessel. Skull: No significant scalp swelling, hematoma or discrete calvarial fracture. Step-off along the outer table near the coronal suture but with contiguity along the inner table is favored to be variant anatomy/atypical fusion particularly given the absence of adjacent swelling or  concerning traumatic findings. Sinuses/Orbits: Paranasal sinuses and mastoid air cells are predominantly clear. Included orbital structures are unremarkable. Other: None CT CERVICAL SPINE FINDINGS Alignment: Mild straightening of the normal cervical lordosis, can be positional in the absence of a cervical stabilization collar and mild flexion seen on scout view. No evidence of traumatic listhesis. No abnormally widened, perched or jumped facets. Normal alignment of the craniocervical and atlantoaxial articulations. Skull base and vertebrae: No acute skull base fracture. No vertebral body fracture or height loss. Normal bone mineralization. No worrisome osseous lesions. Moderate arthrosis about the atlantodental and basion dens intervals. Cervical spondylitic changes are noted, further detailed below. Soft tissues and spinal canal: No pre or paravertebral fluid or swelling. No visible canal hematoma. Airways patent. Minimal cervical carotid atherosclerosis. Disc levels: Multilevel intervertebral disc height loss with spondylitic endplate changes. Some posterior osseous ridging/disc osteophyte complexes are present, most pronounced C4-5 and C6-7 where 0 there is at most mild canal stenosis. Uncinate spurring and facet hypertrophic changes are present as well resulting at most mild foraminal narrowing on the left C3-4,  C4-5 and C6-7. Upper chest: No acute abnormality in the upper chest or imaged lung apices. Calcification in the aortic arch and proximal great vessels. Other: Few small subcentimeter hypoattenuating nodules within a nonenlarged thyroid gland. Not clinically significant; no follow-up imaging recommended (ref: J Am Coll Radiol. 2015 Feb;12(2): 143-50). IMPRESSION: No acute intracranial abnormality. No significant scalp swelling or hematoma. No calvarial fracture. No acute cervical spine fracture or traumatic listhesis. Multilevel mild cervical spondylitic changes, as detailed above. Cervical intracranial  atherosclerosis. Electronically Signed   By: Kreg Shropshire M.D.   On: 03/20/2021 22:49   CT Cervical Spine Wo Contrast  Result Date: 03/20/2021 CLINICAL DATA:  Struck by falling computer monitor EXAM: CT HEAD WITHOUT CONTRAST CT CERVICAL SPINE WITHOUT CONTRAST TECHNIQUE: Multidetector CT imaging of the head and cervical spine was performed following the standard protocol without intravenous contrast. Multiplanar CT image reconstructions of the cervical spine were also generated. COMPARISON:  None. FINDINGS: CT HEAD FINDINGS Brain: No evidence of acute infarction, hemorrhage, hydrocephalus, extra-axial collection, visible mass lesion or mass effect. Vascular: Atherosclerotic calcification of the carotid siphons. No hyperdense vessel. Skull: No significant scalp swelling, hematoma or discrete calvarial fracture. Step-off along the outer table near the coronal suture but with contiguity along the inner table is favored to be variant anatomy/atypical fusion particularly given the absence of adjacent swelling or concerning traumatic findings. Sinuses/Orbits: Paranasal sinuses and mastoid air cells are predominantly clear. Included orbital structures are unremarkable. Other: None CT CERVICAL SPINE FINDINGS Alignment: Mild straightening of the normal cervical lordosis, can be positional in the absence of a cervical stabilization collar and mild flexion seen on scout view. No evidence of traumatic listhesis. No abnormally widened, perched or jumped facets. Normal alignment of the craniocervical and atlantoaxial articulations. Skull base and vertebrae: No acute skull base fracture. No vertebral body fracture or height loss. Normal bone mineralization. No worrisome osseous lesions. Moderate arthrosis about the atlantodental and basion dens intervals. Cervical spondylitic changes are noted, further detailed below. Soft tissues and spinal canal: No pre or paravertebral fluid or swelling. No visible canal hematoma. Airways  patent. Minimal cervical carotid atherosclerosis. Disc levels: Multilevel intervertebral disc height loss with spondylitic endplate changes. Some posterior osseous ridging/disc osteophyte complexes are present, most pronounced C4-5 and C6-7 where 0 there is at most mild canal stenosis. Uncinate spurring and facet hypertrophic changes are present as well resulting at most mild foraminal narrowing on the left C3-4, C4-5 and C6-7. Upper chest: No acute abnormality in the upper chest or imaged lung apices. Calcification in the aortic arch and proximal great vessels. Other: Few small subcentimeter hypoattenuating nodules within a nonenlarged thyroid gland. Not clinically significant; no follow-up imaging recommended (ref: J Am Coll Radiol. 2015 Feb;12(2): 143-50). IMPRESSION: No acute intracranial abnormality. No significant scalp swelling or hematoma. No calvarial fracture. No acute cervical spine fracture or traumatic listhesis. Multilevel mild cervical spondylitic changes, as detailed above. Cervical intracranial atherosclerosis. Electronically Signed   By: Kreg Shropshire M.D.   On: 03/20/2021 22:49      ____________________________________________   INITIAL IMPRESSION / ASSESSMENT AND PLAN / ED COURSE  As part of my medical decision making, I reviewed the following data within the electronic MEDICAL RECORD NUMBER Nursing notes reviewed and incorporated and Notes from prior ED visits        Patient is a 65 yo F who reports to the ER for evaluation of head injury and neck pain after injury at work. See HPI. In triage, pt noted to  be hypertensive. She reports this is related to pain. Otherwise vitals are WNL. Exam as above. CT head and cervical spine show no acute abnormalities. Patient overall appears well, no deficits appreciated. Recheck of BP by myself is significantly lower into the 150s systolic. Discussed treatment for MSK neck pain with antiinflammatories and tylenol. Patient was offered muscle relaxant  but declined these at this time. Patient is stable for outpatient follow up, return precautions were discussed at length.       ____________________________________________   FINAL CLINICAL IMPRESSION(S) / ED DIAGNOSES  Final diagnoses:  Injury of head, initial encounter  Neck pain     ED Discharge Orders     None        Note:  This document was prepared using Dragon voice recognition software and may include unintentional dictation errors.    Lucy ChrisRodgers, Aldwin Micalizzi J, PA 03/20/21 2341    Gilles ChiquitoSmith, Zachary P, MD 03/20/21 [phone]

## 2021-03-20 NOTE — ED Triage Notes (Signed)
Pt states a computer monitor fell off shelf and struck her on the head. Pt complains of head pain and neck pain. No loc.

## 2021-03-20 NOTE — Discharge Instructions (Addendum)
Please take Tylenol, up to 1000mg  every 6 hours as needed for pain. You may also take Naproxen, up to 500mg  2x daily. Return to the ER if you develop ay worsening of symptoms. Otherwise, follow up with your primary care.

## 2021-03-20 NOTE — ED Notes (Signed)
NAD noted at time of D/C. Pt denies questions or concerns. Pt ambulatory to the lobby at this time. Verbal consent for D/C obtained.  

## 2021-05-16 ENCOUNTER — Other Ambulatory Visit: Payer: Self-pay | Admitting: Neurology

## 2021-05-16 DIAGNOSIS — R42 Dizziness and giddiness: Secondary | ICD-10-CM

## 2021-06-13 ENCOUNTER — Ambulatory Visit
Admission: RE | Admit: 2021-06-13 | Discharge: 2021-06-13 | Disposition: A | Discharge status: Home / Self Care | Payer: 59 | Source: Ambulatory Visit | Attending: Neurology | Admitting: Neurology

## 2021-06-13 ENCOUNTER — Other Ambulatory Visit: Payer: Self-pay

## 2021-06-13 DIAGNOSIS — R42 Dizziness and giddiness: Secondary | ICD-10-CM

## 2021-06-13 MED ORDER — GADOBENATE DIMEGLUMINE 529 MG/ML IV SOLN
18.0000 mL | Freq: Once | INTRAVENOUS | Status: AC | PRN
  Administered 2021-06-13: 18 mL via INTRAVENOUS

## 2021-10-18 ENCOUNTER — Other Ambulatory Visit: Payer: Self-pay | Admitting: Student

## 2021-10-18 DIAGNOSIS — Z1231 Encounter for screening mammogram for malignant neoplasm of breast: Secondary | ICD-10-CM

## 2021-11-22 ENCOUNTER — Other Ambulatory Visit: Payer: Self-pay

## 2021-11-22 ENCOUNTER — Ambulatory Visit
Admission: RE | Admit: 2021-11-22 | Discharge: 2021-11-22 | Disposition: A | Discharge status: Home / Self Care | Payer: 59 | Source: Ambulatory Visit | Attending: Student | Admitting: Student

## 2021-11-22 DIAGNOSIS — Z1231 Encounter for screening mammogram for malignant neoplasm of breast: Secondary | ICD-10-CM | POA: Diagnosis not present

## 2022-02-21 ENCOUNTER — Emergency Department: Payer: Medicare Other

## 2022-02-21 ENCOUNTER — Emergency Department
Admission: EM | Admit: 2022-02-21 | Discharge: 2022-02-22 | Disposition: A | Discharge status: Home / Self Care | Payer: Medicare Other | Attending: Emergency Medicine | Admitting: Emergency Medicine

## 2022-02-21 ENCOUNTER — Other Ambulatory Visit: Payer: Self-pay

## 2022-02-21 DIAGNOSIS — R200 Anesthesia of skin: Secondary | ICD-10-CM | POA: Diagnosis present

## 2022-02-21 DIAGNOSIS — Z8679 Personal history of other diseases of the circulatory system: Secondary | ICD-10-CM | POA: Insufficient documentation

## 2022-02-21 DIAGNOSIS — R202 Paresthesia of skin: Secondary | ICD-10-CM | POA: Diagnosis not present

## 2022-02-21 LAB — APTT: aPTT: 30 seconds (ref 24–36)

## 2022-02-21 LAB — DIFFERENTIAL
Abs Immature Granulocytes: 0.03 10*3/uL (ref 0.00–0.07)
Basophils Absolute: 0 10*3/uL (ref 0.0–0.1)
Basophils Relative: 1 %
Eosinophils Absolute: 0.2 10*3/uL (ref 0.0–0.5)
Eosinophils Relative: 4 %
Immature Granulocytes: 1 %
Lymphocytes Relative: 23 %
Lymphs Abs: 1.4 10*3/uL (ref 0.7–4.0)
Monocytes Absolute: 0.6 10*3/uL (ref 0.1–1.0)
Monocytes Relative: 9 %
Neutro Abs: 4.1 10*3/uL (ref 1.7–7.7)
Neutrophils Relative %: 62 %

## 2022-02-21 LAB — COMPREHENSIVE METABOLIC PANEL
ALT: 14 U/L (ref 0–44)
AST: 17 U/L (ref 15–41)
Albumin: 4 g/dL (ref 3.5–5.0)
Alkaline Phosphatase: 70 U/L (ref 38–126)
Anion gap: 12 (ref 5–15)
BUN: 20 mg/dL (ref 8–23)
CO2: 24 mmol/L (ref 22–32)
Calcium: 9.4 mg/dL (ref 8.9–10.3)
Chloride: 106 mmol/L (ref 98–111)
Creatinine, Ser: 0.81 mg/dL (ref 0.44–1.00)
GFR, Estimated: >60 mL/min (ref >60)
Glucose, Bld: 105 mg/dL — ABNORMAL HIGH (ref 70–99)
Potassium: 3.6 mmol/L (ref 3.5–5.1)
Sodium: 142 mmol/L (ref 135–145)
Total Bilirubin: 0.7 mg/dL (ref 0.3–1.2)
Total Protein: 6.7 g/dL (ref 6.5–8.1)

## 2022-02-21 LAB — CBC
HCT: 44.4 % (ref 36.0–46.0)
Hemoglobin: 14.3 g/dL (ref 12.0–15.0)
MCH: 30.4 pg (ref 26.0–34.0)
MCHC: 32.2 g/dL (ref 30.0–36.0)
MCV: 94.3 fL (ref 80.0–100.0)
Platelets: 290 10*3/uL (ref 150–400)
RBC: 4.71 MIL/uL (ref 3.87–5.11)
RDW: 12.3 % (ref 11.5–15.5)
WBC: 6.4 10*3/uL (ref 4.0–10.5)
nRBC: 0 % (ref 0.0–0.2)

## 2022-02-21 LAB — URINALYSIS, ROUTINE W REFLEX MICROSCOPIC
Bacteria, UA: NONE SEEN
Bilirubin Urine: NEGATIVE
Glucose, UA: NEGATIVE mg/dL
Ketones, ur: NEGATIVE mg/dL
Nitrite: NEGATIVE
Protein, ur: NEGATIVE mg/dL
Specific Gravity, Urine: 1.004 — ABNORMAL LOW (ref 1.005–1.030)
pH: 6 (ref 5.0–8.0)

## 2022-02-21 LAB — PROTIME-INR
INR: 1 (ref 0.8–1.2)
Prothrombin Time: 12.9 seconds (ref 11.4–15.2)

## 2022-02-21 MED ORDER — GADOBUTROL 1 MMOL/ML IV SOLN
9.0000 mL | Freq: Once | INTRAVENOUS | Status: AC | PRN
  Administered 2022-02-21: 9 mL via INTRAVENOUS

## 2022-02-21 MED ORDER — SODIUM CHLORIDE 0.9% FLUSH: 3.0000 mL | Freq: Once | INTRAVENOUS | Status: DC

## 2022-02-21 NOTE — ED Notes (Signed)
Pt ambulatory to bathroom at this time with steady gait. No additional needs voiced.

## 2022-02-21 NOTE — ED Notes (Addendum)
Pt connected to continuous monitoring. BP, pulse ox, cardiac monitoring. PA student at bedside at this time

## 2022-02-21 NOTE — ED Provider Notes (Signed)
-----------------------------------------   11:05 PM on 02/21/2022 -----------------------------------------  Blood pressure 139/64, pulse 79, temperature 98.5 F (36.9 C), resp. rate 18, SpO2 96 %.  Assuming care from Dr. Darnelle Catalan.  In [place], Cheryl Dunlap is a 66 y.o. female with a chief complaint of Numbness .  Refer to the original H&P for additional details.  The current plan of care is to follow-up MRI results for numbness given history of carotid dissection.  ----------------------------------------- 1:32 AM on 02/22/2022 ----------------------------------------- MRI Brain negative for stroke or other acute process, MRA negative for dissection.  Patient reports that her symptoms have resolved on reassessment and she is appropriate for discharge home with outpatient neurology follow-up.  She was counseled to return to the ED for new worsening symptoms, patient agrees with plan.    Chesley Noon, MD 02/22/22 [phone]

## 2022-02-21 NOTE — ED Notes (Signed)
Patient transported to MRI at this time. 

## 2022-02-21 NOTE — Discharge Instructions (Addendum)
The MRI and MRA were normal.  Please follow-up with Dr. Clelia Croft your neurologist again.  Give his office a call in the morning let him know what happened.  Please do not hesitate to return if anything gets worse or changes.  Please continue all your medications.

## 2022-02-21 NOTE — ED Triage Notes (Signed)
Pt come with c/o facial numbness that started on Sunday. Pt states it was left sided and now right side too. Pt states pain at base of neck.   Pt states she just feels off. Pt denies any blurry vision or dizziness .  Pt speaking in complete sentences.   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

## 2022-02-22 NOTE — ED Notes (Signed)
Pt is alert and oriented, speaking in full sentences, breathing easy and unlabored, ambulates to the bathroom without assistance, will continue to monitor.

## 2022-02-23 LAB — LYME DISEASE SEROLOGY W/REFLEX: Lyme Total Antibody EIA: NEGATIVE

## 2022-03-14 NOTE — ED Provider Notes (Signed)
[facility] Provider Note    Event Date/Time   First MD Initiated Contact with Patient 02/21/22 2304     (approximate)   History   Numbness   HPI  Cheryl Dunlap is a 66 y.o. female who comes in complaining of numbness in her face that started on 1 side and now encompasses the whole face.  It started at the base of her neck and went up.  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  She has no other complaints.      Physical Exam   Triage Vital Signs: ED Triage Vitals [02/21/22 1433]  Enc Vitals Group     BP (!) 148/80     Pulse Rate 63     Resp 18     Temp 98.5 F (36.9 C)     Temp src      SpO2 95 %     Weight      Height      Head Circumference      Peak Flow      Pain Score 3     Pain Loc      Pain Edu?      Excl. in GC?     Most recent vital signs: Vitals:   02/21/22 2357 02/22/22 0138  BP: 140/82 (!) 142/88  Pulse: 65 (!) 58  Resp: 18 18  Temp:    SpO2: 99% 98%     General: Awake, no distress.  Neck supple no tenderness CV:  Good peripheral perfusion.  Resp:  Normal effort.  Abd:  No distention.  Neurologic exam normal except for numbness on the face and tingling.   ED Results / Procedures / Treatments   Labs (all labs ordered are listed, but only abnormal results are displayed) Labs Reviewed  COMPREHENSIVE METABOLIC PANEL - Abnormal; Notable for the following components:      Result Value   Glucose, Bld 105 (*)    All other components within normal limits  URINALYSIS, ROUTINE W REFLEX MICROSCOPIC - Abnormal; Notable for the following components:   Color, Urine COLORLESS (*)    APPearance CLEAR (*)    Specific Gravity, Urine 1.004 (*)    Hgb urine dipstick MODERATE (*)    Leukocytes,Ua TRACE (*)    All other components within normal limits  PROTIME-INR  APTT  CBC  DIFFERENTIAL  LYME DISEASE SEROLOGY W/REFLEX  CBG MONITORING, ED     EKG  EKG read and interpreted by me shows  normal sinus rhythm rate of 69 right axis no acute changes   RADIOLOGY CT read by radiology reviewed by me shows no acute disease  PROCEDURES:  Critical Care performed:   Procedures   MEDICATIONS ORDERED IN ED: Medications  gadobutrol (GADAVIST) 1 MMOL/ML injection 9 mL (9 mLs Intravenous Contrast Given 02/21/22 2334)     IMPRESSION / MDM / ASSESSMENT AND PLAN / ED COURSE  I reviewed the triage vital signs and the nursing notes. CT is negative patient with history of carotid artery dissection facial numbness we will have to get an MRA or CTA to rule this out.  I will have to sign the patient out to the oncoming provider.     The patient is on the cardiac monitor to evaluate for evidence of arrhythmia and/or significant heart rate changes.  None have been seen.      FINAL CLINICAL IMPRESSION(S) / ED DIAGNOSES   Final diagnoses:  Paresthesia  Rx / DC Orders   ED Discharge Orders     None        Note:  This document was prepared using Dragon voice recognition software and may include unintentional dictation errors.   Arnaldo Natal, MD 03/14/22 [phone]

## 2022-09-01 ENCOUNTER — Other Ambulatory Visit: Payer: Self-pay | Admitting: Student

## 2022-09-01 DIAGNOSIS — Z1231 Encounter for screening mammogram for malignant neoplasm of breast: Secondary | ICD-10-CM

## 2022-11-23 ENCOUNTER — Encounter: Payer: Self-pay | Admitting: Radiology

## 2022-11-23 ENCOUNTER — Ambulatory Visit
Admission: RE | Admit: 2022-11-23 | Discharge: 2022-11-23 | Disposition: A | Discharge status: Home / Self Care | Payer: Medicare Other | Source: Ambulatory Visit | Attending: Student | Admitting: Student

## 2022-11-23 DIAGNOSIS — Z1231 Encounter for screening mammogram for malignant neoplasm of breast: Secondary | ICD-10-CM | POA: Insufficient documentation

## 2022-11-28 ENCOUNTER — Other Ambulatory Visit: Payer: Self-pay | Admitting: Student

## 2022-11-28 DIAGNOSIS — R928 Other abnormal and inconclusive findings on diagnostic imaging of breast: Secondary | ICD-10-CM

## 2022-11-28 DIAGNOSIS — N6489 Other specified disorders of breast: Secondary | ICD-10-CM

## 2022-12-06 ENCOUNTER — Ambulatory Visit
Admission: RE | Admit: 2022-12-06 | Discharge: 2022-12-06 | Disposition: A | Discharge status: Home / Self Care | Payer: Medicare Other | Source: Ambulatory Visit | Attending: Student | Admitting: Student

## 2022-12-06 DIAGNOSIS — N6489 Other specified disorders of breast: Secondary | ICD-10-CM | POA: Diagnosis present

## 2022-12-06 DIAGNOSIS — R928 Other abnormal and inconclusive findings on diagnostic imaging of breast: Secondary | ICD-10-CM | POA: Diagnosis present

## 2023-01-08 ENCOUNTER — Other Ambulatory Visit: Payer: Self-pay

## 2023-01-08 ENCOUNTER — Emergency Department: Payer: No Typology Code available for payment source

## 2023-01-08 ENCOUNTER — Encounter: Payer: Self-pay | Admitting: Emergency Medicine

## 2023-01-08 DIAGNOSIS — R110 Nausea: Secondary | ICD-10-CM | POA: Insufficient documentation

## 2023-01-08 DIAGNOSIS — I2510 Atherosclerotic heart disease of native coronary artery without angina pectoris: Secondary | ICD-10-CM | POA: Diagnosis not present

## 2023-01-08 DIAGNOSIS — Z7982 Long term (current) use of aspirin: Secondary | ICD-10-CM | POA: Diagnosis not present

## 2023-01-08 DIAGNOSIS — R197 Diarrhea, unspecified: Secondary | ICD-10-CM | POA: Insufficient documentation

## 2023-01-08 DIAGNOSIS — R1013 Epigastric pain: Secondary | ICD-10-CM | POA: Diagnosis not present

## 2023-01-08 LAB — CBC WITH DIFFERENTIAL/PLATELET
Abs Immature Granulocytes: 0.05 10*3/uL (ref 0.00–0.07)
Basophils Absolute: 0 10*3/uL (ref 0.0–0.1)
Basophils Relative: 1 %
Eosinophils Absolute: 0.3 10*3/uL (ref 0.0–0.5)
Eosinophils Relative: 5 %
HCT: 45.1 % (ref 36.0–46.0)
Hemoglobin: 14.6 g/dL (ref 12.0–15.0)
Immature Granulocytes: 1 %
Lymphocytes Relative: 30 %
Lymphs Abs: 2 10*3/uL (ref 0.7–4.0)
MCH: 31.1 pg (ref 26.0–34.0)
MCHC: 32.4 g/dL (ref 30.0–36.0)
MCV: 96 fL (ref 80.0–100.0)
Monocytes Absolute: 0.6 10*3/uL (ref 0.1–1.0)
Monocytes Relative: 9 %
Neutro Abs: 3.7 10*3/uL (ref 1.7–7.7)
Neutrophils Relative %: 54 %
Platelets: 250 10*3/uL (ref 150–400)
RBC: 4.7 MIL/uL (ref 3.87–5.11)
RDW: 12.9 % (ref 11.5–15.5)
WBC: 6.7 10*3/uL (ref 4.0–10.5)
nRBC: 0 % (ref 0.0–0.2)

## 2023-01-08 NOTE — ED Triage Notes (Signed)
Patient ambulatory to triage with steady gait, without difficulty or distress noted; pt reports while at [employer], someone was spray painting in the store PTA and she was exposed to it; c/o diarrhea and nausea since; denies pain, denies [facility]

## 2023-01-09 ENCOUNTER — Emergency Department
Admission: EM | Admit: 2023-01-09 | Discharge: 2023-01-09 | Disposition: A | Discharge status: Home / Self Care | Payer: No Typology Code available for payment source | Attending: Emergency Medicine | Admitting: Emergency Medicine

## 2023-01-09 DIAGNOSIS — R110 Nausea: Secondary | ICD-10-CM

## 2023-01-09 DIAGNOSIS — R197 Diarrhea, unspecified: Secondary | ICD-10-CM

## 2023-01-09 LAB — URINALYSIS, ROUTINE W REFLEX MICROSCOPIC
Bacteria, UA: NONE SEEN
Bilirubin Urine: NEGATIVE
Glucose, UA: NEGATIVE mg/dL
Ketones, ur: NEGATIVE mg/dL
Leukocytes,Ua: NEGATIVE
Nitrite: NEGATIVE
Protein, ur: NEGATIVE mg/dL
Specific Gravity, Urine: 1.003 — ABNORMAL LOW (ref 1.005–1.030)
Squamous Epithelial / HPF: NONE SEEN /HPF (ref 0–5)
WBC, UA: NONE SEEN WBC/hpf (ref 0–5)
pH: 6 (ref 5.0–8.0)

## 2023-01-09 LAB — COMPREHENSIVE METABOLIC PANEL
ALT: 18 U/L (ref 0–44)
AST: 18 U/L (ref 15–41)
Albumin: 4.1 g/dL (ref 3.5–5.0)
Alkaline Phosphatase: 57 U/L (ref 38–126)
Anion gap: 6 (ref 5–15)
BUN: 28 mg/dL — ABNORMAL HIGH (ref 8–23)
CO2: 27 mmol/L (ref 22–32)
Calcium: 8.7 mg/dL — ABNORMAL LOW (ref 8.9–10.3)
Chloride: 106 mmol/L (ref 98–111)
Creatinine, Ser: 0.85 mg/dL (ref 0.44–1.00)
GFR, Estimated: >60 mL/min (ref >60)
Glucose, Bld: 98 mg/dL (ref 70–99)
Potassium: 3.5 mmol/L (ref 3.5–5.1)
Sodium: 139 mmol/L (ref 135–145)
Total Bilirubin: 0.6 mg/dL (ref 0.3–1.2)
Total Protein: 6.7 g/dL (ref 6.5–8.1)

## 2023-01-09 LAB — URINE DRUG SCREEN, QUALITATIVE (ARMC ONLY)
Amphetamines, Ur Screen: NOT DETECTED
Barbiturates, Ur Screen: NOT DETECTED
Benzodiazepine, Ur Scrn: NOT DETECTED
Cannabinoid 50 Ng, Ur ~~LOC~~: NOT DETECTED
Cocaine Metabolite,Ur ~~LOC~~: NOT DETECTED
MDMA (Ecstasy)Ur Screen: NOT DETECTED
Methadone Scn, Ur: NOT DETECTED
Opiate, Ur Screen: NOT DETECTED
Phencyclidine (PCP) Ur S: NOT DETECTED
Tricyclic, Ur Screen: NOT DETECTED

## 2023-01-09 LAB — LIPASE, BLOOD: Lipase: 37 U/L (ref 11–51)

## 2023-01-09 LAB — TROPONIN I (HIGH SENSITIVITY): Troponin I (High Sensitivity): 4 ng/L (ref <18)

## 2023-01-09 MED ORDER — ONDANSETRON 4 MG PO TBDP
4.0000 mg | ORAL_TABLET | Freq: Once | ORAL | Status: AC
  Administered 2023-01-09: 4 mg via ORAL
  Filled 2023-01-09: qty 1

## 2023-01-09 MED ORDER — ONDANSETRON 4 MG PO TBDP: 4.0000 mg | ORAL_TABLET | Freq: Three times a day (TID) | ORAL | 0 refills | Status: DC | PRN

## 2023-01-09 NOTE — ED Provider Notes (Signed)
[facility] Provider Note    Event Date/Time   First MD Initiated Contact with Patient 01/09/23 [phone]     (approximate)   History   Nausea   HPI  Cheryl Dunlap is a 67 y.o. female who presents to the ED from home with a chief complaint of nausea and diarrhea.  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  While at work tonight, she was wearing a mask but smelled fumes due to somebody spray painting in the store and she was exposed to it.  She went home early, thinking she was fine but since being home she has had nausea and explosive diarrhea with abdominal cramps.  Denies chest pain, cough, shortness of breath, vomiting or dysuria.     Past Medical History   Past Medical History:  Diagnosis Date   Constipation    Coronary artery disease    carotid dissection   Depression    Hyperlipidemia      Active Problem List  There are no problems to display for this patient.    Past Surgical History   Past Surgical History:  Procedure Laterality Date   BREAST BIOPSY Right 10 plus yrs   benign   COLONOSCOPY     COLONOSCOPY WITH PROPOFOL N/A 09/22/2020   Procedure: COLONOSCOPY WITH PROPOFOL;  Surgeon: Toledo, Boykin Nearing, MD;  Location: [facility] ENDOSCOPY;  Service: Gastroenterology;  Laterality: N/A;     Home Medications   Prior to Admission medications   Medication Sig Start Date End Date Taking? Authorizing Provider  ondansetron (ZOFRAN-ODT) 4 MG disintegrating tablet Take 1 tablet (4 mg total) by mouth every 8 (eight) hours as needed for nausea or vomiting. 01/09/23  Yes Irean Hong, MD  aspirin EC 81 MG tablet Take 81 mg by mouth at bedtime.  Patient not taking: Reported on 09/22/2020    [provider]  Bioflavonoid Products (GRAPE SEED PO) Take 1 capsule by mouth at bedtime.    [provider]  glucosamine-chondroitin 500-400 MG tablet Take 1 tablet by mouth at bedtime.    [provider]  Multiple  Vitamins-Minerals (ONE-A-DAY WOMENS 50+ ADVANTAGE) TABS Take 1 tablet by mouth daily.    [provider]  Omega-3 Fatty Acids (FISH OIL PO) Take 1 capsule by mouth daily.    [provider]  Probiotic Product (ALIGN PO) Take 1 capsule by mouth daily.    [provider]  saccharomyces boulardii (FLORASTOR) 250 MG capsule Take 250 mg by mouth daily.    [provider]  simvastatin (ZOCOR) 10 MG tablet Take 15 mg by mouth at bedtime.    [provider]  UBIQUINOL PO Take 1 capsule by mouth daily.    [provider]     Allergies  Eryc [erythromycin], Lincomycin, and Sulfa antibiotics   Family History   Family History  Problem Relation Age of Onset   Breast cancer Mother        pt not sure of age   Breast cancer Maternal Grandmother        pt not sure of age     Physical Exam  Triage Vital Signs: ED Triage Vitals  Enc Vitals Group     BP 01/08/23 2335 (!) 163/105     Pulse Rate 01/08/23 2335 65     Resp 01/08/23 2335 17     Temp 01/08/23 2335 98.2 F (36.8 C)     Temp Source 01/08/23 2335 Oral     SpO2  01/08/23 2335 96 %     Weight --      Height --      Head Circumference --      Peak Flow --      Pain Score 01/08/23 2334 0     Pain Loc --      Pain Edu? --      Excl. in GC? --     Updated Vital Signs: BP (!) 163/105 (BP Location: Left Arm)   Pulse 65   Temp 98.2 F (36.8 C) (Oral)   Resp 17   SpO2 96%    General: Awake, no distress.  CV:  RRR.  Good peripheral perfusion.  Resp:  Normal effort.  CTAB. Abd:  Mild epigastric tenderness to palpation without rebound or guarding.  No distention.  Other:  No truncal vesicles.   ED Results / Procedures / Treatments  Labs (all labs ordered are listed, but only abnormal results are displayed) Labs Reviewed  URINALYSIS, ROUTINE W REFLEX MICROSCOPIC - Abnormal; Notable for the following components:      Result Value   Color, Urine COLORLESS (*)    APPearance  CLEAR (*)    Specific Gravity, Urine 1.003 (*)    Hgb urine dipstick MODERATE (*)    All other components within normal limits  COMPREHENSIVE METABOLIC PANEL - Abnormal; Notable for the following components:   BUN 28 (*)    Calcium 8.7 (*)    All other components within normal limits  CBC WITH DIFFERENTIAL/PLATELET  LIPASE, BLOOD  URINE DRUG SCREEN, QUALITATIVE ([facility] ONLY)  TROPONIN I (HIGH SENSITIVITY)     EKG  None   RADIOLOGY I have independently visualized and interpreted patient's x-ray as well as noted the radiology interpretation:  X-ray: No acute cardiopulmonary process  Official radiology report(s): DG Chest 2 View  Result Date: 01/08/2023 CLINICAL DATA:  Nausea EXAM: CHEST - 2 VIEW COMPARISON:  04/02/2020 FINDINGS: Lungs are clear.  No pleural effusion or pneumothorax. The heart is normal in size. Visualized osseous structures are within normal limits. IMPRESSION: Normal chest radiographs. Electronically Signed   By: Charline Bills M.D.   On: 01/08/2023 23:56     PROCEDURES:  Critical Care performed: No  Procedures   MEDICATIONS ORDERED IN ED: Medications  ondansetron (ZOFRAN-ODT) disintegrating tablet 4 mg (4 mg Oral Given 01/09/23 0426)     IMPRESSION / MDM / ASSESSMENT AND PLAN / ED COURSE  I reviewed the triage vital signs and the nursing notes.                             67 year old female presenting with nausea and diarrhea after breathing and spray paint fumes at work. Differential diagnosis includes, but is not limited to, ovarian cyst, ovarian torsion, acute appendicitis, diverticulitis, urinary tract infection/pyelonephritis, endometriosis, bowel obstruction, colitis, renal colic, gastroenteritis, hernia, fibroids, endometriosis, etc. I personally reviewed patient's records and note a neurology office visit on 11/28/2022 for imbalance.  Patient's presentation is most consistent with acute complicated illness / injury requiring diagnostic  workup.  Results demonstrate normal WBC 6.7, normal electrolytes including LFTs/lipase, troponin negative, UA negative, chest x-ray is clear.  Patient feeling significantly better other than slight nausea at this time.  Will administer ODT Zofran.  Patient declines right upper quadrant ultrasound.  Strict return precautions given.  Patient verbalizes understanding and agrees with plan of care.      FINAL CLINICAL IMPRESSION(S) / ED DIAGNOSES  Final diagnoses:  Nausea  Diarrhea, unspecified type     Rx / DC Orders   ED Discharge Orders          Ordered    ondansetron (ZOFRAN-ODT) 4 MG disintegrating tablet  Every 8 hours PRN        01/09/23 0415             Note:  This document was prepared using Dragon voice recognition software and may include unintentional dictation errors.   Irean Hong, MD 01/09/23 [phone]

## 2023-01-09 NOTE — Discharge Instructions (Signed)
You may take Zofran as needed for nausea.  BRAT diet x 3 days, then slowly advance diet as tolerated.  Return to the ER for worsening symptoms, persistent vomiting, difficulty breathing or other concerns.

## 2023-11-28 ENCOUNTER — Emergency Department (HOSPITAL_COMMUNITY)
Admission: EM | Admit: 2023-11-28 | Discharge: 2023-11-29 | Disposition: A | Discharge status: Home / Self Care | Attending: Student | Admitting: Student

## 2023-11-28 ENCOUNTER — Encounter (HOSPITAL_COMMUNITY): Payer: Self-pay

## 2023-11-28 ENCOUNTER — Emergency Department (HOSPITAL_COMMUNITY)

## 2023-11-28 DIAGNOSIS — R202 Paresthesia of skin: Secondary | ICD-10-CM | POA: Diagnosis present

## 2023-11-28 DIAGNOSIS — I2510 Atherosclerotic heart disease of native coronary artery without angina pectoris: Secondary | ICD-10-CM | POA: Insufficient documentation

## 2023-11-28 LAB — URINALYSIS, ROUTINE W REFLEX MICROSCOPIC
Bacteria, UA: NONE SEEN
Bilirubin Urine: NEGATIVE
Glucose, UA: NEGATIVE mg/dL
Ketones, ur: NEGATIVE mg/dL
Nitrite: NEGATIVE
Protein, ur: NEGATIVE mg/dL
Specific Gravity, Urine: 1.008 (ref 1.005–1.030)
pH: 5 (ref 5.0–8.0)

## 2023-11-28 LAB — BASIC METABOLIC PANEL
Anion gap: 11 (ref 5–15)
BUN: 26 mg/dL — ABNORMAL HIGH (ref 8–23)
CO2: 23 mmol/L (ref 22–32)
Calcium: 9.3 mg/dL (ref 8.9–10.3)
Chloride: 106 mmol/L (ref 98–111)
Creatinine, Ser: 0.93 mg/dL (ref 0.44–1.00)
GFR, Estimated: >60 mL/min (ref >60)
Glucose, Bld: 97 mg/dL (ref 70–99)
Potassium: 3.8 mmol/L (ref 3.5–5.1)
Sodium: 140 mmol/L (ref 135–145)

## 2023-11-28 LAB — CBC
HCT: 45 % (ref 36.0–46.0)
Hemoglobin: 14.6 g/dL (ref 12.0–15.0)
MCH: 31 pg (ref 26.0–34.0)
MCHC: 32.4 g/dL (ref 30.0–36.0)
MCV: 95.5 fL (ref 80.0–100.0)
Platelets: 254 10*3/uL (ref 150–400)
RBC: 4.71 MIL/uL (ref 3.87–5.11)
RDW: 12.4 % (ref 11.5–15.5)
WBC: 7.6 10*3/uL (ref 4.0–10.5)
nRBC: 0 % (ref 0.0–0.2)

## 2023-11-28 LAB — CBG MONITORING, ED: Glucose-Capillary: 91 mg/dL (ref 70–99)

## 2023-11-28 NOTE — ED Provider Triage Note (Signed)
 Emergency Medicine Provider Triage Evaluation Note  Cheryl Dunlap , a 68 y.o. female  was evaluated in triage.  Pt complains of numbness to the left side of the face, specifically overlying the V3 nerve distribution.  Numbness radiates into the left side of her neck.  Symptoms have been mild for the past 1.5 weeks, however worsened at 4:30 PM today.  No pain.  No headaches or vision changes..  Review of Systems  Positive: As above Negative: As above  Physical Exam  BP (!) 155/88 (BP Location: Right Arm)   Pulse 73   Temp 98.2 F (36.8 C)   Resp 17   Ht 5\' 6"  (1.676 m)   Wt 86.2 kg   SpO2 99%   BMI 30.67 kg/m  Gen:   Awake, no distress   Resp:  Normal effort  MSK:   Moves extremities without difficulty  Other:  No facial drooping or slurred speech.  No weakness of upper or lower extremities  Medical Decision Making  Medically screening exam initiated at 7:10 PM.  Appropriate orders placed.  Cheryl Dunlap was informed that the remainder of the evaluation will be completed by another provider, this initial triage assessment does not replace that evaluation, and the importance of remaining in the ED until their evaluation is complete.  Workup initiated   Mora Bellman 11/28/23 1911

## 2023-11-28 NOTE — ED Triage Notes (Addendum)
 Pt c/o increasing tingling in L lower face, neck and L shoulder x 1.5 weeks and weakness/fatigue this evening.  Pt reports tingling goes away while she sleeps and then increases as the day goes on.   Facial symmetry noted.  Equal grips and strength in BUEs and BLEs.  Pt denies vision changes.

## 2023-11-29 NOTE — ED Provider Notes (Signed)
 [place] EMERGENCY DEPARTMENT AT [facility] Provider Note  CSN: [id number] Arrival date & time: 11/28/23 1840  Chief Complaint(s) Tingling  HPI Cheryl Dunlap is a 68 y.o. female with PMH right-sided carotid dissection, depression, HLD, constipation who presents emergency room for evaluation of left-sided facial paresthesias.  States that symptoms have been intermittent over the last 1 and half weeks but significant worsened at 4:30 PM today.  At time of my evaluation, symptoms have significantly improved and are very mild.  No associated facial weakness.  Denies numbness, tingling, weakness of the upper extremities or lower extremities.  No additional cranial nerve complaints today.  Denies chest pain, shortness of breath, Donnell pain, nausea, vomiting or other systemic symptoms.  No headaches or vision changes.  Patient does wear a tight N95 daily at work.   Past Medical History Past Medical History:  Diagnosis Date   Constipation    Coronary artery disease    carotid dissection   Depression    Hyperlipidemia    There are no active problems to display for this patient.  Home Medication(s) Prior to Admission medications   Medication Sig Start Date End Date Taking? Authorizing Provider  aspirin EC 81 MG tablet Take 81 mg by mouth at bedtime.  Patient not taking: Reported on 09/22/2020    [provider]  Bioflavonoid Products (GRAPE SEED PO) Take 1 capsule by mouth at bedtime.    [provider]  glucosamine-chondroitin 500-400 MG tablet Take 1 tablet by mouth at bedtime.    [provider]  Multiple Vitamins-Minerals (ONE-A-DAY WOMENS 50+ ADVANTAGE) TABS Take 1 tablet by mouth daily.    [provider]  Omega-3 Fatty Acids (FISH OIL PO) Take 1 capsule by mouth daily.    [provider]  ondansetron (ZOFRAN-ODT) 4 MG disintegrating tablet Take 1 tablet (4 mg total) by mouth every 8 (eight) hours as needed for nausea or vomiting.  01/09/23   Irean Hong, MD  Probiotic Product (ALIGN PO) Take 1 capsule by mouth daily.    [provider]  saccharomyces boulardii (FLORASTOR) 250 MG capsule Take 250 mg by mouth daily.    [provider]  simvastatin (ZOCOR) 10 MG tablet Take 15 mg by mouth at bedtime.    [provider]  UBIQUINOL PO Take 1 capsule by mouth daily.    [provider]                                                                                                                                    Past Surgical History Past Surgical History:  Procedure Laterality Date   BREAST BIOPSY Right 10 plus yrs   benign   COLONOSCOPY     COLONOSCOPY WITH PROPOFOL N/A 09/22/2020   Procedure: COLONOSCOPY WITH PROPOFOL;  Surgeon: Toledo, Boykin Nearing, MD;  Location: [facility] ENDOSCOPY;  Service: Gastroenterology;  Laterality: N/A;   Family History Family History  Problem Relation Age of Onset   Breast cancer Mother        pt not sure of age   Breast cancer Maternal Grandmother        pt not sure of age    Social History Social History   Tobacco Use   Smoking status: Never   Smokeless tobacco: Never  Substance Use Topics   Alcohol use: Never   Drug use: Never   Allergies Eryc [erythromycin], Lincomycin, and Sulfa antibiotics  Review of Systems Review of Systems  Neurological:  Positive for numbness.    Physical Exam Vital Signs  I have reviewed the triage vital signs BP (!) 155/88 (BP Location: Right Arm)   Pulse 73   Temp 98.2 F (36.8 C)   Resp 17   Ht 5\' 6"  (1.676 m)   Wt 86.2 kg   SpO2 99%   BMI 30.67 kg/m   Physical Exam Vitals and nursing note reviewed.  Constitutional:      General: She is not in acute distress.    Appearance: She is well-developed.  HENT:     Head: Normocephalic and atraumatic.  Eyes:     Conjunctiva/sclera: Conjunctivae normal.  Cardiovascular:     Rate and Rhythm: Normal rate and regular rhythm.     Heart sounds: No murmur  heard. Pulmonary:     Effort: Pulmonary effort is normal. No respiratory distress.     Breath sounds: Normal breath sounds.  Abdominal:     Palpations: Abdomen is soft.     Tenderness: There is no abdominal tenderness.  Musculoskeletal:        General: No swelling.     Cervical back: Neck supple.  Skin:    General: Skin is warm and dry.     Capillary Refill: Capillary refill takes less than 2 seconds.  Neurological:     General: No focal deficit present.     Mental Status: She is alert.     Cranial Nerves: No cranial nerve deficit.     Sensory: No sensory deficit.     Motor: No weakness.  Psychiatric:        Mood and Affect: Mood normal.     ED Results and Treatments Labs (all labs ordered are listed, but only abnormal results are displayed) Labs Reviewed  BASIC METABOLIC PANEL - Abnormal; Notable for the following components:      Result Value   BUN 26 (*)    All other components within normal limits  URINALYSIS, ROUTINE W REFLEX MICROSCOPIC - Abnormal; Notable for the following components:   Color, Urine STRAW (*)    Hgb urine dipstick MODERATE (*)    Leukocytes,Ua MODERATE (*)    All other components within normal limits  CBC  CBG MONITORING, ED  CBG MONITORING, ED  Radiology CT Cervical Spine Wo Contrast Result Date: 11/28/2023 CLINICAL DATA:  Left-sided facial numbness peer EXAM: CT CERVICAL SPINE WITHOUT CONTRAST TECHNIQUE: Multidetector CT imaging of the cervical spine was performed without intravenous contrast. Multiplanar CT image reconstructions were also generated. RADIATION DOSE REDUCTION: This exam was performed according to the departmental dose-optimization program which includes automated exposure control, adjustment of the mA and/or kV according to patient size and/or use of iterative reconstruction technique. COMPARISON:  March 20, 2021  FINDINGS: Alignment: Normal. Skull base and vertebrae: No acute fracture. No primary bone lesion or focal pathologic process. Soft tissues and spinal canal: No prevertebral fluid or swelling. No visible canal hematoma. Disc levels: Moderate to marked severity multilevel endplate sclerosis is seen throughout all levels of the cervical spine. Moderate to marked severity multilevel intervertebral disc space narrowing is also seen throughout the cervical spine. Bilateral mild-to-moderate severity multilevel facet joint hypertrophy is noted. Upper chest: Negative. Other: None. IMPRESSION: 1. Moderate to marked severity multilevel degenerative changes throughout the cervical spine. 2. No acute cervical spine fracture or subluxation. Electronically Signed   By: Aram Candela M.D.   On: 11/28/2023 22:10   CT Head Wo Contrast Result Date: 11/28/2023 CLINICAL DATA:  Left-sided facial numbness. EXAM: CT HEAD WITHOUT CONTRAST TECHNIQUE: Contiguous axial images were obtained from the base of the skull through the vertex without intravenous contrast. RADIATION DOSE REDUCTION: This exam was performed according to the departmental dose-optimization program which includes automated exposure control, adjustment of the mA and/or kV according to patient size and/or use of iterative reconstruction technique. COMPARISON:  Feb 21, 2022 FINDINGS: Brain: No evidence of acute infarction, hemorrhage, hydrocephalus, extra-axial collection or mass lesion/mass effect. Vascular: No hyperdense vessel or unexpected calcification. Skull: Normal. Negative for fracture or focal lesion. Sinuses/Orbits: No acute finding. Other: None. IMPRESSION: No acute intracranial pathology. Electronically Signed   By: Aram Candela M.D.   On: 11/28/2023 22:06    Pertinent labs & imaging results that were available during my care of the patient were reviewed by me and considered in my medical decision making (see MDM for details).  Medications Ordered  in ED Medications - No data to display                                                                                                                                   Procedures Procedures  (including critical care time)  Medical Decision Making / ED Course   This patient presents to the ED for concern of paresthesia, this involves an extensive number of treatment options, and is a complaint that carries with it a high risk of complications and morbidity.  The differential diagnosis includes Bell's palsy, CVA, peripheral neuropraxia, trigeminal compression, electrolyte abnormality, anxiety  MDM: Patient seen emergency room for evaluation of facial paresthesias.  Physical exam with very slight subjective sensory deficit on the V3 distribution on the left but neurologic Sam otherwise unremarkable with  no focal motor or sensory deficits.  No cranial nerve deficits.  Laboratory valuation unremarkable.  CT C-spine with degenerative changes but no central compression.  CT head unremarkable.  Patient has a neurology follow-up appointment tomorrow.  At this time she does not meet inpatient criteria for admission and will be discharged with outpatient neurology follow-up tomorrow.  Peripheral compression of V3 is on the differential given her daily mask use under the ear but will leave additional workup to outpatient neurologic providers.  Very low suspicion for acute CVA today.  At this time she does not meet inpatient criteria for admission and will be discharged with outpatient follow-up   Additional history obtained:  -External records from outside source obtained and reviewed including: Chart review including previous notes, labs, imaging, consultation notes   Lab Tests: -I ordered, reviewed, and interpreted labs.   The pertinent results include:   Labs Reviewed  BASIC METABOLIC PANEL - Abnormal; Notable for the following components:      Result Value   BUN 26 (*)    All other components  within normal limits  URINALYSIS, ROUTINE W REFLEX MICROSCOPIC - Abnormal; Notable for the following components:   Color, Urine STRAW (*)    Hgb urine dipstick MODERATE (*)    Leukocytes,Ua MODERATE (*)    All other components within normal limits  CBC  CBG MONITORING, ED  CBG MONITORING, ED      EKG   EKG Interpretation Date/Time:  Wednesday November 28 2023 18:54:46 EST Ventricular Rate:  71 PR Interval:  194 QRS Duration:  84 QT Interval:  400 QTC Calculation: 434 R Axis:   162  Text Interpretation: Normal sinus rhythm Right axis deviation Abnormal ECG When compared with ECG of 21-Feb-2022 14:35, PREVIOUS ECG IS PRESENT Confirmed by Anasofia Micallef ([id number]) on 11/29/2023 12:21:05 AM         Imaging Studies ordered: I ordered imaging studies including CT head, C-spine I independently visualized and interpreted imaging. I agree with the radiologist interpretation   Medicines ordered and prescription drug management: No orders of the defined types were placed in this encounter.   -I have reviewed the patients home medicines and have made adjustments as needed  Critical interventions none    Social Determinants of Health:  Factors impacting patients care include: none   Reevaluation: After the interventions noted above, I reevaluated the patient and found that they have :stayed the same  Co morbidities that complicate the patient evaluation  Past Medical History:  Diagnosis Date   Constipation    Coronary artery disease    carotid dissection   Depression    Hyperlipidemia       Dispostion: I considered admission for this patient, but at this time she does not meet inpatient criteria for admission and will be discharged with outpatient follow-up.     Final Clinical Impression(s) / ED Diagnoses Final diagnoses:  Paresthesia     @PCDICTATION @    Glendora Score, MD 11/29/23 0021

## 2023-11-30 ENCOUNTER — Other Ambulatory Visit: Payer: Self-pay | Admitting: Student

## 2023-11-30 DIAGNOSIS — R200 Anesthesia of skin: Secondary | ICD-10-CM

## 2023-11-30 DIAGNOSIS — I7771 Dissection of carotid artery: Secondary | ICD-10-CM

## 2023-12-05 ENCOUNTER — Ambulatory Visit
Admission: RE | Admit: 2023-12-05 | Discharge: 2023-12-05 | Disposition: A | Discharge status: Home / Self Care | Source: Ambulatory Visit | Attending: Student | Admitting: Student

## 2023-12-05 DIAGNOSIS — I7771 Dissection of carotid artery: Secondary | ICD-10-CM | POA: Diagnosis present

## 2023-12-05 DIAGNOSIS — R200 Anesthesia of skin: Secondary | ICD-10-CM | POA: Diagnosis present

## 2023-12-05 MED ORDER — IOHEXOL 350 MG/ML SOLN
75.0000 mL | Freq: Once | INTRAVENOUS | Status: AC | PRN
  Administered 2023-12-05: 75 mL via INTRAVENOUS

## 2024-02-24 ENCOUNTER — Emergency Department

## 2024-02-24 ENCOUNTER — Other Ambulatory Visit: Payer: Self-pay

## 2024-02-24 ENCOUNTER — Emergency Department
Admission: EM | Admit: 2024-02-24 | Discharge: 2024-02-25 | Disposition: A | Discharge status: Home / Self Care | Attending: Emergency Medicine | Admitting: Emergency Medicine

## 2024-02-24 ENCOUNTER — Encounter: Payer: Self-pay | Admitting: Emergency Medicine

## 2024-02-24 DIAGNOSIS — E860 Dehydration: Secondary | ICD-10-CM | POA: Diagnosis not present

## 2024-02-24 DIAGNOSIS — Y990 Civilian activity done for income or pay: Secondary | ICD-10-CM | POA: Insufficient documentation

## 2024-02-24 DIAGNOSIS — R202 Paresthesia of skin: Secondary | ICD-10-CM | POA: Diagnosis not present

## 2024-02-24 DIAGNOSIS — R55 Syncope and collapse: Secondary | ICD-10-CM | POA: Insufficient documentation

## 2024-02-24 DIAGNOSIS — I2510 Atherosclerotic heart disease of native coronary artery without angina pectoris: Secondary | ICD-10-CM | POA: Insufficient documentation

## 2024-02-24 LAB — BASIC METABOLIC PANEL WITH GFR
Anion gap: 10 (ref 5–15)
BUN: 35 mg/dL — ABNORMAL HIGH (ref 8–23)
CO2: 23 mmol/L (ref 22–32)
Calcium: 9.5 mg/dL (ref 8.9–10.3)
Chloride: 102 mmol/L (ref 98–111)
Creatinine, Ser: 1.08 mg/dL — ABNORMAL HIGH (ref 0.44–1.00)
GFR, Estimated: 56 mL/min — ABNORMAL LOW (ref >60)
Glucose, Bld: 100 mg/dL — ABNORMAL HIGH (ref 70–99)
Potassium: 3.7 mmol/L (ref 3.5–5.1)
Sodium: 135 mmol/L (ref 135–145)

## 2024-02-24 LAB — TROPONIN I (HIGH SENSITIVITY): Troponin I (High Sensitivity): 4 ng/L (ref <18)

## 2024-02-24 LAB — CBC
HCT: 44.7 % (ref 36.0–46.0)
Hemoglobin: 14.8 g/dL (ref 12.0–15.0)
MCH: 31.2 pg (ref 26.0–34.0)
MCHC: 33.1 g/dL (ref 30.0–36.0)
MCV: 94.1 fL (ref 80.0–100.0)
Platelets: 263 10*3/uL (ref 150–400)
RBC: 4.75 MIL/uL (ref 3.87–5.11)
RDW: 12.6 % (ref 11.5–15.5)
WBC: 8.1 10*3/uL (ref 4.0–10.5)
nRBC: 0 % (ref 0.0–0.2)

## 2024-02-24 NOTE — ED Provider Notes (Signed)
 [facility] Provider Note   Event Date/Time   First MD Initiated Contact with Patient 02/24/24 2259     (approximate) History  Near Syncope  HPI Cheryl Dunlap is a 68 y.o. female with a past medical history of hyperlipidemia and coronary artery disease who presents after a near syncopal event at work.  Patient states that she had palpitations with left-sided upper chest wall pain, headache, and left arm paresthesias.  Patient also endorses associated indigestion.  Patient denies any exertional worsening of the symptoms.  Patient denies any other exacerbating or relieving factors. ROS: Patient currently denies any vision changes, tinnitus, difficulty speaking, facial droop, sore throat, shortness of breath, abdominal pain, nausea/vomiting/diarrhea, dysuria, or weakness/numbness in any extremity   Physical Exam  Triage Vital Signs: ED Triage Vitals  Encounter Vitals Group     BP 02/24/24 2114 139/84     Systolic BP Percentile --      Diastolic BP Percentile --      Pulse Rate 02/24/24 2114 67     Resp 02/24/24 2114 18     Temp 02/24/24 2114 98.4 F (36.9 C)     Temp Source 02/24/24 2114 Oral     SpO2 02/24/24 2114 100 %     Weight 02/24/24 2110 187 lb (84.8 kg)     Height 02/24/24 2110 5\' 5"  (1.651 m)     Head Circumference --      Peak Flow --      Pain Score 02/24/24 2109 3     Pain Loc --      Pain Education --      Exclude from Growth Chart --    Most recent vital signs: Vitals:   02/24/24 2330 02/25/24 0100  BP: 135/84   Pulse: 61   Resp: 18   Temp:  98 F (36.7 C)  SpO2: 98%    General: Awake, oriented x4. CV:  Good peripheral perfusion. Resp:  Normal effort. Abd:  No distention. Other:  Elderly obese Caucasian female resting comfortably in no acute distress.  Sensation intact to bilateral upper and lower extremities ED Results / Procedures / Treatments  Labs (all labs ordered are listed, but only abnormal results are displayed) Labs  Reviewed  BASIC METABOLIC PANEL WITH GFR - Abnormal; Notable for the following components:      Result Value   Glucose, Bld 100 (*)    BUN 35 (*)    Creatinine, Ser 1.08 (*)    GFR, Estimated 56 (*)    All other components within normal limits  CBC  TROPONIN I (HIGH SENSITIVITY)  TROPONIN I (HIGH SENSITIVITY)   EKG ED ECG REPORT I, Charleen Conn, the attending physician, personally viewed and interpreted this ECG. Date: 02/24/2024 EKG Time: 2112 Rate: 69 Rhythm: normal sinus rhythm QRS Axis: normal Intervals: normal ST/T Wave abnormalities: normal Narrative Interpretation: no evidence of acute ischemia RADIOLOGY ED MD interpretation: 2 view chest x-ray interpreted by me shows no evidence of acute abnormalities including no pneumonia, pneumothorax, or widened mediastinum - All radiology independently interpreted and agree with radiology assessment Official radiology report(s): CT Head Wo Contrast Result Date: 02/24/2024 CLINICAL DATA:  Neuro deficit, acute, stroke suspected L arm numbness EXAM: CT HEAD WITHOUT CONTRAST TECHNIQUE: Contiguous axial images were obtained from the base of the skull through the vertex without intravenous contrast. RADIATION DOSE REDUCTION: This exam was performed according to the departmental dose-optimization program which includes automated exposure control, adjustment of the mA and/or kV  according to patient size and/or use of iterative reconstruction technique. COMPARISON:  12/05/2023 FINDINGS: Brain: Normal anatomic configuration. No abnormal intra or extra-axial mass lesion or fluid collection. No abnormal mass effect or midline shift. No evidence of acute intracranial hemorrhage or infarct. Ventricular size is normal. Cerebellum unremarkable. Vascular: Unremarkable Skull: Intact Sinuses/Orbits: Paranasal sinuses are clear. Orbits are unremarkable. Other: Mastoid air cells and middle ear cavities are clear. IMPRESSION: 1. Normal CT examination of the  brain. Electronically Signed   By: Worthy Heads M.D.   On: 02/24/2024 23:51   DG Chest 2 View Result Date: 02/24/2024 CLINICAL DATA:  Left chest pain, left upper extremity numbness, weakness, and indigestion. EXAM: CHEST - 2 VIEW COMPARISON:  PA Lat chest 01/08/2023 FINDINGS: The heart size and mediastinal contours are within normal limits. Both lungs are clear. The visualized skeletal structures are intact, with slight thoracic kyphodextroscoliosis and mild degenerative changes, generalized osteopenia. IMPRESSION: No evidence of acute chest disease. Osteopenia. Electronically Signed   By: Denman Fischer M.D.   On: 02/24/2024 21:42   PROCEDURES: Critical Care performed: No .1-3 Lead EKG Interpretation  Performed by: Charleen Conn, MD Authorized by: Charleen Conn, MD     Interpretation: normal     ECG rate:  62   ECG rate assessment: normal     Rhythm: sinus rhythm     Ectopy: none     Conduction: normal    MEDICATIONS ORDERED IN ED: Medications - No data to display IMPRESSION / MDM / ASSESSMENT AND PLAN / ED COURSE  I reviewed the triage vital signs and the nursing notes.                             The patient is on the cardiac monitor to evaluate for evidence of arrhythmia and/or significant heart rate changes. Patient's presentation is most consistent with acute presentation with potential threat to life or bodily function. Patient is a 68 year old female with the above-stated past medical history who presents for left upper chest wall pain, presyncope, headache, and paresthesias of the left upper extremity Workup: ECG, CXR, CBC, BMP, Troponin Findings: ECG: No overt evidence of STEMI. No evidence of Brugada's sign, delta wave, epsilon wave, significantly prolonged QTc, or malignant arrhythmia HS Troponin: Negative x1 Other Labs unremarkable for emergent problems. CXR: Without PTX, PNA, or widened mediastinum Last Stress Test:  2018 Last Heart Catheterization:  na HEART  Score: 4  Given History, Exam, and Workup I have low suspicion for ACS, Pneumothorax, Pneumonia, Pulmonary Embolus, Tamponade, Aortic Dissection or other emergent problem as a cause for this presentation.   Reassesment: Prior to discharge patient's pain was controlled and they were well appearing.  Disposition:  Discharge. Strict return precautions discussed with patient with full understanding. Advised patient to follow up promptly with primary care provider    FINAL CLINICAL IMPRESSION(S) / ED DIAGNOSES   Final diagnoses:  Near syncope  Dehydration  Paresthesia of left arm   Rx / DC Orders   ED Discharge Orders     None      Note:  This document was prepared using Dragon voice recognition software and may include unintentional dictation errors.   Charleen Conn, MD 02/25/24 [phone]

## 2024-02-24 NOTE — ED Notes (Signed)
 RN to bedside to answer call bell. Pt needed to use the restroom. Pt asked had she provided a specimen yet and she said yes. Pt unhooked and ambulated to toilet in room.

## 2024-02-24 NOTE — ED Notes (Signed)
 Urine specimen sent to the lab

## 2024-02-24 NOTE — ED Triage Notes (Signed)
 Pt to ED via POV with c/o near syncope while at work. Pt states L sided chest pain, HA, L arm numbness, weakness, and indigestion. Pt states was not doing anything strenuous while at work.

## 2024-02-24 NOTE — ED Notes (Signed)
 Patient transported to CT

## 2024-02-25 DIAGNOSIS — R55 Syncope and collapse: Secondary | ICD-10-CM | POA: Diagnosis not present

## 2024-02-25 LAB — TROPONIN I (HIGH SENSITIVITY): Troponin I (High Sensitivity): 5 ng/L (ref <18)

## 2024-02-29 ENCOUNTER — Emergency Department
Admission: EM | Admit: 2024-02-29 | Discharge: 2024-02-29 | Disposition: A | Discharge status: Home / Self Care | Attending: Emergency Medicine | Admitting: Emergency Medicine

## 2024-02-29 ENCOUNTER — Other Ambulatory Visit: Payer: Self-pay

## 2024-02-29 DIAGNOSIS — R202 Paresthesia of skin: Secondary | ICD-10-CM | POA: Insufficient documentation

## 2024-02-29 DIAGNOSIS — R002 Palpitations: Secondary | ICD-10-CM | POA: Diagnosis not present

## 2024-02-29 LAB — CBC
HCT: 45.2 % (ref 36.0–46.0)
Hemoglobin: 15 g/dL (ref 12.0–15.0)
MCH: 31.2 pg (ref 26.0–34.0)
MCHC: 33.2 g/dL (ref 30.0–36.0)
MCV: 94 fL (ref 80.0–100.0)
Platelets: 260 10*3/uL (ref 150–400)
RBC: 4.81 MIL/uL (ref 3.87–5.11)
RDW: 12.7 % (ref 11.5–15.5)
WBC: 6.8 10*3/uL (ref 4.0–10.5)
nRBC: 0 % (ref 0.0–0.2)

## 2024-02-29 LAB — COMPREHENSIVE METABOLIC PANEL WITH GFR
ALT: 13 U/L (ref 0–44)
AST: 19 U/L (ref 15–41)
Albumin: 4.2 g/dL (ref 3.5–5.0)
Alkaline Phosphatase: 56 U/L (ref 38–126)
Anion gap: 9 (ref 5–15)
BUN: 22 mg/dL (ref 8–23)
CO2: 24 mmol/L (ref 22–32)
Calcium: 9.6 mg/dL (ref 8.9–10.3)
Chloride: 107 mmol/L (ref 98–111)
Creatinine, Ser: 0.84 mg/dL (ref 0.44–1.00)
GFR, Estimated: >60 mL/min (ref >60)
Glucose, Bld: 126 mg/dL — ABNORMAL HIGH (ref 70–99)
Potassium: 3.8 mmol/L (ref 3.5–5.1)
Sodium: 140 mmol/L (ref 135–145)
Total Bilirubin: 0.7 mg/dL (ref 0.0–1.2)
Total Protein: 7 g/dL (ref 6.5–8.1)

## 2024-02-29 LAB — TROPONIN I (HIGH SENSITIVITY): Troponin I (High Sensitivity): 5 ng/L (ref <18)

## 2024-02-29 NOTE — ED Triage Notes (Signed)
 Pt states that she had a near syncopal episode Sunday and was seen in the ER and diagnosed with dehydration, pt states that she heart rate is skipping, states that she has a bad headache this am and reports the ongoing numbness in her left arm is worse and is radiating up to her neck, pt reports that she feels faint and pt reports that she drove herself here

## 2024-02-29 NOTE — ED Provider Notes (Signed)
 [facility] Provider Note    Event Date/Time   First MD Initiated Contact with Patient 02/29/24 1247     (approximate)   History   Weakness, Numbness, Headache, and Irregular Heart Beat   HPI Cheryl Dunlap is a 68 y.o. female with history of HLD presenting today for palpitations and paresthesias.  Patient states she was seen in the ED several days ago for palpitation with near syncope.  Since then she has not had any recurrence of near syncopal episodes but occasionally feels palpitations.  She also notes some paresthesias to the left side of her face as well as her left hand.  This has been happening since March and seems to be getting more progressive.  Was seen for similar symptoms and last ED visit with negative CT head.  She came back today because she saw a reading that was abnormal on her last EKG.  Already has cardiology follow-up planned next week.  Chart review: Reviewed last ED note and workup     Physical Exam   Triage Vital Signs: ED Triage Vitals  Encounter Vitals Group     BP 02/29/24 0939 139/88     Systolic BP Percentile --      Diastolic BP Percentile --      Pulse Rate 02/29/24 0939 74     Resp 02/29/24 0939 18     Temp 02/29/24 0939 98.3 F (36.8 C)     Temp Source 02/29/24 0939 Oral     SpO2 02/29/24 0939 99 %     Weight 02/29/24 0940 185 lb (83.9 kg)     Height 02/29/24 0940 5\' 6"  (1.676 m)     Head Circumference --      Peak Flow --      Pain Score 02/29/24 0940 5     Pain Loc --      Pain Education --      Exclude from Growth Chart --     Most recent vital signs: Vitals:   02/29/24 0939 02/29/24 1319  BP: 139/88 133/73  Pulse: 74 (!) 56  Resp: 18 18  Temp: 98.3 F (36.8 C) (!) 97.5 F (36.4 C)  SpO2: 99% 100%   Physical Exam: I have reviewed the vital signs and nursing notes. General: Awake, alert, no acute distress.  Nontoxic appearing. Head:  Atraumatic, normocephalic.   ENT:  EOM intact, PERRL. Oral mucosa  is pink and moist with no lesions. Neck: Neck is supple with full range of motion, No meningeal signs. Cardiovascular:  RRR, No murmurs. Peripheral pulses palpable and equal bilaterally. Respiratory:  Symmetrical chest wall expansion.  No rhonchi, rales, or wheezes.  Good air movement throughout.  No use of accessory muscles.   Musculoskeletal:  No cyanosis or edema. Moving extremities with full ROM Abdomen:  Soft, nontender, nondistended. Neuro:  GCS 15, moving all four extremities, interacting appropriately. Speech clear.  Intermittent tingling sensation in her left hand and left side of her face.  No overt numbness in these areas or elsewhere.  Strength equal and intact throughout bilateral upper and lower extremities.  Cranial nerves II through XII otherwise intact Psych:  Calm, appropriate.   Skin:  Warm, dry, no rash.    ED Results / Procedures / Treatments   Labs (all labs ordered are listed, but only abnormal results are displayed) Labs Reviewed  COMPREHENSIVE METABOLIC PANEL WITH GFR - Abnormal; Notable for the following components:      Result Value   Glucose,  Bld 126 (*)    All other components within normal limits  CBC  CBG MONITORING, ED  TROPONIN I (HIGH SENSITIVITY)     EKG My EKG interpretation: Rate of 71, right axis deviation, normal intervals.  No acute ST elevations or depressions   RADIOLOGY    PROCEDURES:  Critical Care performed: No  Procedures   MEDICATIONS ORDERED IN ED: Medications - No data to display   IMPRESSION / MDM / ASSESSMENT AND PLAN / ED COURSE  I reviewed the triage vital signs and the nursing notes.                              Differential diagnosis includes, but is not limited to, palpitations, PVCs, cardiac arrhythmia, electrolyte abnormality, dehydration, anxiety  Patient's presentation is most consistent with acute complicated illness / injury requiring diagnostic workup.  Patient is a 68 year old female presenting today  for intermittent palpitations and ongoing paresthesias.  Her paresthesias in her left hand and left face have been present for several months at this point.  She had a CT head within the last week that was unremarkable.  This seems less consistent with acute CVA as they come and go and seem to be when she is having palpitation episodes.  There may be a component of anxiety which she admits to as well.  Otherwise her EKG with no ischemic findings.  Troponin negative.  CBC and CMP normal.  No indication to repeat troponin with no ongoing syncopal or chest pain episodes.  Patient states she mostly came in because she saw an abnormal finding on her EKG.  Her last 1 was reassuring and her 1 here today does not show obvious life-threatening pathology at this time.  She already has planned follow-up with her cardiology team 4 days from now and I think it is safe for her to be discharged and follow-up with them to discuss outpatient modalities such as Holter monitor or echo.  Given strict return precautions and agreeable with plan.  The patient is on the cardiac monitor to evaluate for evidence of arrhythmia and/or significant heart rate changes.     FINAL CLINICAL IMPRESSION(S) / ED DIAGNOSES   Final diagnoses:  Palpitations  Paresthesia     Rx / DC Orders   ED Discharge Orders     None        Note:  This document was prepared using Dragon voice recognition software and may include unintentional dictation errors.   Kandee Orion, MD 02/29/24 [phone]

## 2024-02-29 NOTE — Discharge Instructions (Addendum)
 Your laboratory workup was reassuring here today.  Please follow-up with your cardiology team next Tuesday as planned.  If you have any recurrent syncopal episodes before then or other concerning pathology, please return to the ED for further evaluation.

## 2024-03-05 ENCOUNTER — Other Ambulatory Visit: Payer: Self-pay

## 2024-03-05 DIAGNOSIS — R200 Anesthesia of skin: Secondary | ICD-10-CM

## 2024-03-05 DIAGNOSIS — R202 Paresthesia of skin: Secondary | ICD-10-CM

## 2024-03-09 ENCOUNTER — Ambulatory Visit
Admission: RE | Admit: 2024-03-09 | Discharge: 2024-03-09 | Disposition: A | Discharge status: Home / Self Care | Source: Ambulatory Visit

## 2024-03-09 ENCOUNTER — Other Ambulatory Visit: Payer: Self-pay

## 2024-03-09 DIAGNOSIS — R200 Anesthesia of skin: Secondary | ICD-10-CM

## 2024-03-09 DIAGNOSIS — R202 Paresthesia of skin: Secondary | ICD-10-CM

## 2024-03-10 ENCOUNTER — Ambulatory Visit: Admission: RE | Admit: 2024-03-10 | Source: Ambulatory Visit

## 2024-03-11 ENCOUNTER — Other Ambulatory Visit: Payer: Self-pay

## 2024-03-11 ENCOUNTER — Ambulatory Visit: Admission: RE | Admit: 2024-03-11 | Source: Ambulatory Visit

## 2024-03-11 DIAGNOSIS — R200 Anesthesia of skin: Secondary | ICD-10-CM

## 2024-03-11 DIAGNOSIS — R202 Paresthesia of skin: Secondary | ICD-10-CM

## 2024-03-11 DIAGNOSIS — G509 Disorder of trigeminal nerve, unspecified: Secondary | ICD-10-CM

## 2024-03-12 ENCOUNTER — Other Ambulatory Visit: Payer: Self-pay

## 2024-03-12 DIAGNOSIS — R200 Anesthesia of skin: Secondary | ICD-10-CM

## 2024-03-12 DIAGNOSIS — M8788 Other osteonecrosis, other site: Secondary | ICD-10-CM

## 2024-03-12 DIAGNOSIS — G509 Disorder of trigeminal nerve, unspecified: Secondary | ICD-10-CM

## 2024-03-12 DIAGNOSIS — R6884 Jaw pain: Secondary | ICD-10-CM

## 2024-03-12 DIAGNOSIS — G500 Trigeminal neuralgia: Secondary | ICD-10-CM

## 2024-03-12 DIAGNOSIS — R202 Paresthesia of skin: Secondary | ICD-10-CM

## 2024-03-13 ENCOUNTER — Encounter: Payer: Self-pay | Admitting: Emergency Medicine

## 2024-03-13 ENCOUNTER — Other Ambulatory Visit: Payer: Self-pay

## 2024-03-13 ENCOUNTER — Emergency Department
Admission: EM | Admit: 2024-03-13 | Discharge: 2024-03-13 | Disposition: A | Discharge status: Home / Self Care | Attending: Emergency Medicine | Admitting: Emergency Medicine

## 2024-03-13 ENCOUNTER — Emergency Department

## 2024-03-13 DIAGNOSIS — R519 Headache, unspecified: Secondary | ICD-10-CM | POA: Diagnosis not present

## 2024-03-13 DIAGNOSIS — R300 Dysuria: Secondary | ICD-10-CM | POA: Diagnosis not present

## 2024-03-13 DIAGNOSIS — I7121 Aneurysm of the ascending aorta, without rupture: Secondary | ICD-10-CM | POA: Diagnosis not present

## 2024-03-13 DIAGNOSIS — R200 Anesthesia of skin: Secondary | ICD-10-CM | POA: Diagnosis not present

## 2024-03-13 DIAGNOSIS — E876 Hypokalemia: Secondary | ICD-10-CM | POA: Insufficient documentation

## 2024-03-13 DIAGNOSIS — I2510 Atherosclerotic heart disease of native coronary artery without angina pectoris: Secondary | ICD-10-CM | POA: Diagnosis not present

## 2024-03-13 DIAGNOSIS — R55 Syncope and collapse: Secondary | ICD-10-CM | POA: Insufficient documentation

## 2024-03-13 DIAGNOSIS — F419 Anxiety disorder, unspecified: Secondary | ICD-10-CM | POA: Insufficient documentation

## 2024-03-13 DIAGNOSIS — M542 Cervicalgia: Secondary | ICD-10-CM | POA: Diagnosis not present

## 2024-03-13 DIAGNOSIS — I10 Essential (primary) hypertension: Secondary | ICD-10-CM | POA: Insufficient documentation

## 2024-03-13 DIAGNOSIS — R002 Palpitations: Secondary | ICD-10-CM | POA: Diagnosis not present

## 2024-03-13 DIAGNOSIS — R079 Chest pain, unspecified: Secondary | ICD-10-CM

## 2024-03-13 DIAGNOSIS — R0789 Other chest pain: Secondary | ICD-10-CM | POA: Diagnosis present

## 2024-03-13 LAB — MAGNESIUM: Magnesium: 2.4 mg/dL (ref 1.7–2.4)

## 2024-03-13 LAB — URINALYSIS, ROUTINE W REFLEX MICROSCOPIC
Bacteria, UA: NONE SEEN
Bilirubin Urine: NEGATIVE
Glucose, UA: NEGATIVE mg/dL
Ketones, ur: 5 mg/dL — AB
Nitrite: NEGATIVE
Protein, ur: NEGATIVE mg/dL
Specific Gravity, Urine: 1.018 (ref 1.005–1.030)
pH: 6 (ref 5.0–8.0)

## 2024-03-13 LAB — CBC WITH DIFFERENTIAL/PLATELET
Abs Immature Granulocytes: 0.05 10*3/uL (ref 0.00–0.07)
Basophils Absolute: 0 10*3/uL (ref 0.0–0.1)
Basophils Relative: 1 %
Eosinophils Absolute: 0.3 10*3/uL (ref 0.0–0.5)
Eosinophils Relative: 4 %
HCT: 45.4 % (ref 36.0–46.0)
Hemoglobin: 15.2 g/dL — ABNORMAL HIGH (ref 12.0–15.0)
Immature Granulocytes: 1 %
Lymphocytes Relative: 34 %
Lymphs Abs: 2.9 10*3/uL (ref 0.7–4.0)
MCH: 31.4 pg (ref 26.0–34.0)
MCHC: 33.5 g/dL (ref 30.0–36.0)
MCV: 93.8 fL (ref 80.0–100.0)
Monocytes Absolute: 0.8 10*3/uL (ref 0.1–1.0)
Monocytes Relative: 9 %
Neutro Abs: 4.5 10*3/uL (ref 1.7–7.7)
Neutrophils Relative %: 51 %
Platelets: 265 10*3/uL (ref 150–400)
RBC: 4.84 MIL/uL (ref 3.87–5.11)
RDW: 12.6 % (ref 11.5–15.5)
WBC: 8.6 10*3/uL (ref 4.0–10.5)
nRBC: 0 % (ref 0.0–0.2)

## 2024-03-13 LAB — COMPREHENSIVE METABOLIC PANEL WITH GFR
ALT: 14 U/L (ref 0–44)
AST: 21 U/L (ref 15–41)
Albumin: 4.4 g/dL (ref 3.5–5.0)
Alkaline Phosphatase: 57 U/L (ref 38–126)
Anion gap: 12 (ref 5–15)
BUN: 22 mg/dL (ref 8–23)
CO2: 23 mmol/L (ref 22–32)
Calcium: 9.8 mg/dL (ref 8.9–10.3)
Chloride: 104 mmol/L (ref 98–111)
Creatinine, Ser: 0.88 mg/dL (ref 0.44–1.00)
GFR, Estimated: >60 mL/min (ref >60)
Glucose, Bld: 99 mg/dL (ref 70–99)
Potassium: 3.3 mmol/L — ABNORMAL LOW (ref 3.5–5.1)
Sodium: 139 mmol/L (ref 135–145)
Total Bilirubin: 0.9 mg/dL (ref 0.0–1.2)
Total Protein: 7.3 g/dL (ref 6.5–8.1)

## 2024-03-13 LAB — URINE DRUG SCREEN, QUALITATIVE (ARMC ONLY)
Amphetamines, Ur Screen: NOT DETECTED
Barbiturates, Ur Screen: NOT DETECTED
Benzodiazepine, Ur Scrn: NOT DETECTED
Cannabinoid 50 Ng, Ur ~~LOC~~: NOT DETECTED
Cocaine Metabolite,Ur ~~LOC~~: NOT DETECTED
MDMA (Ecstasy)Ur Screen: NOT DETECTED
Methadone Scn, Ur: NOT DETECTED
Opiate, Ur Screen: POSITIVE — AB
Phencyclidine (PCP) Ur S: NOT DETECTED
Tricyclic, Ur Screen: NOT DETECTED

## 2024-03-13 LAB — TROPONIN I (HIGH SENSITIVITY)
Troponin I (High Sensitivity): 6 ng/L (ref <18)
Troponin I (High Sensitivity): 6 ng/L (ref <18)

## 2024-03-13 LAB — TSH: TSH: 3.088 u[IU]/mL (ref 0.350–4.500)

## 2024-03-13 MED ORDER — ONDANSETRON HCL 4 MG/2ML IJ SOLN
4.0000 mg | Freq: Once | INTRAMUSCULAR | Status: AC
  Administered 2024-03-13: 4 mg via INTRAVENOUS
  Filled 2024-03-13: qty 2

## 2024-03-13 MED ORDER — SODIUM CHLORIDE 0.9 % IV BOLUS (SEPSIS)
1000.0000 mL | Freq: Once | INTRAVENOUS | Status: AC
  Administered 2024-03-13: 1000 mL via INTRAVENOUS

## 2024-03-13 MED ORDER — KETOROLAC TROMETHAMINE 30 MG/ML IJ SOLN
15.0000 mg | Freq: Once | INTRAMUSCULAR | Status: AC
  Administered 2024-03-13: 15 mg via INTRAVENOUS
  Filled 2024-03-13: qty 1

## 2024-03-13 MED ORDER — MORPHINE SULFATE (PF) 4 MG/ML IV SOLN
4.0000 mg | Freq: Once | INTRAVENOUS | Status: AC
  Administered 2024-03-13: 4 mg via INTRAVENOUS
  Filled 2024-03-13: qty 1

## 2024-03-13 MED ORDER — GADOBUTROL 1 MMOL/ML IV SOLN
8.0000 mL | Freq: Once | INTRAVENOUS | Status: AC | PRN
  Administered 2024-03-13: 7 mL via INTRAVENOUS

## 2024-03-13 MED ORDER — POTASSIUM CHLORIDE CRYS ER 20 MEQ PO TBCR
40.0000 meq | EXTENDED_RELEASE_TABLET | Freq: Once | ORAL | Status: AC
  Administered 2024-03-13: 40 meq via ORAL
  Filled 2024-03-13: qty 2

## 2024-03-13 MED ORDER — IOHEXOL 350 MG/ML SOLN
125.0000 mL | Freq: Once | INTRAVENOUS | Status: AC | PRN
  Administered 2024-03-13: 100 mL via INTRAVENOUS

## 2024-03-13 NOTE — ED Triage Notes (Signed)
 Pt presents to the ED via POV with complaints of CP that started last night prior to going to bed. Pt states she had a headache last night which she took 500mg  ASA with no relief. She states her heart is racing and doesn't feel right. A&Ox4 at this time. Denies SOB.

## 2024-03-13 NOTE — ED Notes (Signed)
 Pt to MRI

## 2024-03-13 NOTE — ED Provider Notes (Signed)
 [facility] Provider Note    Event Date/Time   First MD Initiated Contact with Patient 03/13/24 [phone]     (approximate)   History   Chest Pain   HPI  Cheryl Dunlap is a 68 y.o. female with history of hypertension, hyperlipidemia, right carotid artery dissection in 2014 who presents to the emergency department with complaints of posterior headache that started earlier today, neck pain and now having chest pressure, shortness of breath, nausea and feeling like she is going to pass out.  This is her third visit to the emergency department in the past month for the same.  Her workup has been reassuring and she has cardiology follow-up scheduled and has an MRI of her brain ordered as well.  Patient is hyperventilating, appears uncomfortable and states she is having a hard time answering questions due to her headache and neck pain.  She reports feeling weak in her arms.  No fevers or cough.  She denies history of anxiety.  Patient was last seen by neurology on 03/04/2024 and has orders for MRI brain with and without contrast, MRI with and without contrast of the trigeminal nerve.  States she went to have these test done last week but states something was wrong with the order.  She was also seen by cardiology on 03/04/2024 who recommended cardiac Holter monitoring for 3 to 7 days and echo stress test.  States she has already worn her Holter monitor and sent that in.  She is waiting to get the stress test scheduled.   History provided by patient.    Past Medical History:  Diagnosis Date   Constipation    Coronary artery disease    carotid dissection   Depression    Hyperlipidemia     Past Surgical History:  Procedure Laterality Date   BREAST BIOPSY Right 10 plus yrs   benign   COLONOSCOPY     COLONOSCOPY WITH PROPOFOL  N/A 09/22/2020   Procedure: COLONOSCOPY WITH PROPOFOL ;  Surgeon: Toledo, Alphonsus Jeans, MD;  Location: [facility] ENDOSCOPY;  Service: Gastroenterology;   Laterality: N/A;    MEDICATIONS:  Prior to Admission medications   Medication Sig Start Date End Date Taking? Authorizing Provider  aspirin EC 81 MG tablet Take 81 mg by mouth at bedtime.  Patient not taking: Reported on 09/22/2020    [provider]  Bioflavonoid Products (GRAPE SEED PO) Take 1 capsule by mouth at bedtime.    [provider]  glucosamine-chondroitin 500-400 MG tablet Take 1 tablet by mouth at bedtime.    [provider]  Multiple Vitamins-Minerals (ONE-A-DAY WOMENS 50+ ADVANTAGE) TABS Take 1 tablet by mouth daily.    [provider]  Omega-3 Fatty Acids (FISH OIL PO) Take 1 capsule by mouth daily.    [provider]  ondansetron  (ZOFRAN -ODT) 4 MG disintegrating tablet Take 1 tablet (4 mg total) by mouth every 8 (eight) hours as needed for nausea or vomiting. 01/09/23   Sung, Jade J, MD  Probiotic Product (ALIGN PO) Take 1 capsule by mouth daily.    [provider]  saccharomyces boulardii (FLORASTOR) 250 MG capsule Take 250 mg by mouth daily.    [provider]  simvastatin (ZOCOR) 10 MG tablet Take 15 mg by mouth at bedtime.    [provider]  UBIQUINOL PO Take 1 capsule by mouth daily.    [provider]    Physical Exam   Triage Vital Signs: ED Triage Vitals  Encounter Vitals Group  BP 03/13/24 0306 (!) 162/84     Girls Systolic BP Percentile --      Girls Diastolic BP Percentile --      Boys Systolic BP Percentile --      Boys Diastolic BP Percentile --      Pulse Rate 03/13/24 0306 81     Resp 03/13/24 0306 18     Temp 03/13/24 0306 98 F (36.7 C)     Temp src --      SpO2 03/13/24 0306 100 %     Weight 03/13/24 0305 183 lb (83 kg)     Height 03/13/24 0305 5' 6 (1.676 m)     Head Circumference --      Peak Flow --      Pain Score 03/13/24 0305 7     Pain Loc --      Pain Education --      Exclude from Growth Chart --     Most recent vital signs: Vitals:   03/13/24  0306 03/13/24 0315  BP: (!) 162/84 (!) 154/80  Pulse: 81   Resp: 18 19  Temp: 98 F (36.7 C)   SpO2: 100%     CONSTITUTIONAL: Alert, responds appropriately to questions.  Appears very anxious.  Has a hard time answering questions which she states is secondary to pain.  Is hyperventilating. HEAD: Normocephalic, atraumatic EYES: Conjunctivae clear, pupils appear equal, sclera nonicteric ENT: normal nose; moist mucous membranes NECK: Supple, normal ROM, tender over the posterior neck without midline step-off or deformity CARD: RRR; S1 and S2 appreciated RESP: Normal chest excursion without splinting or tachypnea; breath sounds clear and equal bilaterally; no wheezes, no rhonchi, no rales, no hypoxia or respiratory distress, speaking full sentences ABD/GI: Non-distended; soft, non-tender, no rebound, no guarding, no peritoneal signs BACK: The back appears normal EXT: Normal ROM in all joints; no deformity noted, no edema, no calf tenderness or calf swelling SKIN: Normal color for age and race; warm; no rash on exposed skin NEURO: Moves all extremities equally, normal speech, no facial asymmetry PSYCH: The patient's mood and manner are appropriate.   ED Results / Procedures / Treatments   LABS: (all labs ordered are listed, but only abnormal results are displayed) Labs Reviewed  CBC WITH DIFFERENTIAL/PLATELET - Abnormal; Notable for the following components:      Result Value   Hemoglobin 15.2 (*)    All other components within normal limits  COMPREHENSIVE METABOLIC PANEL WITH GFR - Abnormal; Notable for the following components:   Potassium 3.3 (*)    All other components within normal limits  URINALYSIS, ROUTINE W REFLEX MICROSCOPIC - Abnormal; Notable for the following components:   Color, Urine STRAW (*)    APPearance CLEAR (*)    Hgb urine dipstick SMALL (*)    Ketones, ur 5 (*)    Leukocytes,Ua SMALL (*)    All other components within normal limits  URINE DRUG SCREEN,  QUALITATIVE ([facility] ONLY) - Abnormal; Notable for the following components:   Opiate, Ur Screen POSITIVE (*)    All other components within normal limits  URINE CULTURE  MAGNESIUM  TSH  TROPONIN I (HIGH SENSITIVITY)  TROPONIN I (HIGH SENSITIVITY)     EKG:  EKG Interpretation Date/Time:  Thursday March 13 2024 03:10:38 EDT Ventricular Rate:  87 PR Interval:  176 QRS Duration:  76 QT Interval:  384 QTC Calculation: 462 R Axis:   -76  Text Interpretation: No significant change since last tracing Sinus rhythm Artifact  Nonspecific ST and T wave abnormality Abnormal ECG Confirmed by Verneda Golder [phone]) on 03/13/2024 3:32:19 AM         RADIOLOGY: My personal review and interpretation of imaging: MRI brain shows no stroke.  CTA shows no dissection.  I have personally reviewed all radiology reports.   MR Brain W and Wo Contrast Result Date: 03/13/2024 CLINICAL DATA:  68 year old female with severe headache and neck pain. Chest pain onset last night. EXAM: MRI HEAD WITHOUT AND WITH CONTRAST TECHNIQUE: Multiplanar, multiecho pulse sequences of the brain and surrounding structures were obtained without and with intravenous contrast. CONTRAST:  7mL GADAVIST  GADOBUTROL  1 MMOL/ML IV SOLN COMPARISON:  CT head, CTA head and neck 0357 hours today. Brain MRI 02/21/2022. FINDINGS: Brain: Normal cerebral volume for age. No restricted diffusion to suggest acute infarction. No midline shift, mass effect, evidence of mass lesion, ventriculomegaly, extra-axial collection or acute intracranial hemorrhage. Cervicomedullary junction and pituitary are within normal limits. Martina Sledge and white matter signal stable since 2023, within normal limits for age. No cortical encephalomalacia or chronic cerebral blood products identified. No abnormal enhancement identified.  No dural thickening. Vascular: Major intracranial vascular flow voids are stable since 2023. Following contrast major dural venous sinuses are enhancing and  appear to be patent. Skull and upper cervical spine: Stable, negative. Sinuses/Orbits: Stable, negative. Other: Visible internal auditory structures appear normal. Mastoids are clear. Negative visible scalp and face. IMPRESSION: Stable since 2023 and Normal for age MRI appearance of the Brain. Electronically Signed   By: Marlise Simpers M.D.   On: 03/13/2024 05:56   CT Angio Chest/Abd/Pel for Dissection W and/or Wo Contrast Result Date: 03/13/2024 CLINICAL DATA:  68 year old female with severe headache and neck pain. Chest pain onset last night. EXAM: CT ANGIOGRAPHY CHEST, ABDOMEN AND PELVIS TECHNIQUE: Non-contrast CT of the chest was initially obtained. Multidetector CT imaging through the chest, abdomen and pelvis was performed using the standard protocol during bolus administration of intravenous contrast. Multiplanar reconstructed images and MIPs were obtained and reviewed to evaluate the vascular anatomy. RADIATION DOSE REDUCTION: This exam was performed according to the departmental dose-optimization program which includes automated exposure control, adjustment of the mA and/or kV according to patient size and/or use of iterative reconstruction technique. CONTRAST:  OMNIPAQUE  IOHEXOL  350 MG/ML SOLN COMPARISON:  CTA neck today reported separately. Portable chest 0321 hours. FINDINGS: CTA CHEST FINDINGS Cardiovascular: Calcified aortic atherosclerosis. Calcified coronary artery atherosclerosis (series 4, image 85). Normal heart size. No pericardial effusion. Ascending thoracic aorta measures 42 mm diameter, fusiform aneurysmal (series 9, image 77 and series 12, image 57). But negative for thoracic aortic dissection. The aorta smoothly tapers and the arch and descending segments are nonaneurysmal. Central pulmonary arteries appear to be patent, enhancing. Mediastinum/Nodes: Chronic post granulomatous small and coarsely calcified mediastinal and bilateral hilar lymph nodes. No superimposed mediastinal mass or  lymphadenopathy. Lungs/Pleura: Major airways are patent lung volumes are normal. No pleural effusion or confluent lung opacity. Calcified right upper lobe granuloma series 5, image 67 (no follow-up imaging recommended). Posterior right lower lobe pleural thickening without calcification on series 5, image 73. No pneumothorax or suspicious pulmonary opacity. Musculoskeletal: Midthoracic chronic disc and endplate degeneration with degenerative endplate and vertebral body sclerosis. No acute or suspicious osseous lesion in the chest. Review of the MIP images confirms the above findings. CTA ABDOMEN AND PELVIS FINDINGS VASCULAR Aortoiliac calcified atherosclerosis. Mild arterial tortuosity throughout the abdomen and pelvis but negative for abdominal aortic aneurysm or dissection. Major arterial structures in  the abdomen and pelvis, proximal femoral arteries are patent. Early portal venous contrast timing. Review of the MIP images confirms the above findings. NON-VASCULAR Hepatobiliary: Early arterial enhancement appearance of the liver and gallbladder, normal. Pancreas: Negative. Spleen: Negative. Adrenals/Urinary Tract: Normal adrenal glands. Nonobstructed kidneys with symmetric renal enhancement. Benign left renal parapelvic cysts are suspected (no follow-up imaging recommended). No nephrolithiasis or evidence of renal inflammation. Diminutive ureters. Unremarkable bladder. Stomach/Bowel: Redundant but nondilated large bowel. Retained stool throughout the right colon and transverse colon. Cecum located in the posterior deep pelvis with retained stool. Normal appendix tracking cephalad anterior to the sacrum in the midline on series 9, image 256. Nondilated small bowel. Decompressed stomach and duodenum. No pneumoperitoneum, free fluid or mesenteric inflammation identified. Lymphatic: No lymphadenopathy. Reproductive: Within normal limits. Other: No pelvis free fluid. Musculoskeletal: Grade 1 spondylolisthesis in the  lower lumbar spine with moderate bilateral lumbar facet arthropathy. No acute or suspicious osseous lesion. Review of the MIP images confirms the above findings. IMPRESSION: 1. 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 Negative for Aortic Dissection. Recommend annual imaging followup by CTA or MRA. This recommendation follows 2010 ACCF/AHA/AATS/ACR/ASA/SCA/SCAI/SIR/STS/SVM Guidelines for the Diagnosis and Management of Patients with Thoracic Aortic Disease. Circulation. 2010; 121: W098-J191. Aortic aneurysm NOS (ICD10-I71.9) 2. Aortic Atherosclerosis (ICD10-I70.0). Calcified coronary artery atherosclerosis. 3. No superimposed acute or inflammatory process identified in the chest, abdomen, or pelvis. Remote granulomatous infection in the right lung and mediastinum. Electronically Signed   By: Marlise Simpers M.D.   On: 03/13/2024 04:29   CT ANGIO HEAD NECK W WO CM Result Date: 03/13/2024 CLINICAL DATA:  68 year old female with severe headache and neck pain. Chest pain onset last night. EXAM: CT ANGIOGRAPHY HEAD AND NECK WITH AND WITHOUT CONTRAST TECHNIQUE: Multidetector CT imaging of the head and neck was performed using the standard protocol during bolus administration of intravenous contrast. Multiplanar CT image reconstructions and MIPs were obtained to evaluate the vascular anatomy. Carotid stenosis measurements (when applicable) are obtained utilizing NASCET criteria, using the distal internal carotid diameter as the denominator. RADIATION DOSE REDUCTION: This exam was performed according to the departmental dose-optimization program which includes automated exposure control, adjustment of the mA and/or kV according to patient size and/or use of iterative reconstruction technique. CONTRAST:  OMNIPAQUE  IOHEXOL  350 MG/ML SOLN COMPARISON:  Head CT 02/24/2024, CTA head and neck 12/05/2023. FINDINGS: CT HEAD Brain: Cerebral volume remains normal. No midline shift, ventriculomegaly,  mass effect, evidence of mass lesion, intracranial hemorrhage or evidence of cortically based acute infarction. Gray-white matter differentiation is within normal limits for age throughout the brain. Calvarium and skull base: Stable. No acute osseous abnormality identified. Paranasal sinuses: Visualized paranasal sinuses and mastoids are stable and well aerated. Orbits: Visualized orbits and scalp soft tissues are within normal limits. CTA NECK Skeleton: Left TMJ degeneration. Mild for age cervical spine degeneration. No acute osseous abnormality identified. Upper chest: Small postinflammatory calcified mediastinal and right hilar lymph nodes. Negative visible lungs. Other neck: Nonvascular neck soft tissue spaces are within normal limits. Aortic arch: 3 vessel arch.  Calcified aortic atherosclerosis. Right carotid system: Mildly tortuous brachiocephalic artery and right CCA origin. Capacious right carotid bifurcation. Tortuous right ICA below the skull base. No plaque or stenosis. Left carotid system: Similar left carotid tortuosity with no plaque or stenosis to the skull base. Vertebral arteries: Mildly tortuous proximal right subclavian artery and right vertebral artery origins with no plaque or stenosis. Patent right vertebral artery to the skull base  with no plaque or stenosis. Proximal left subclavian artery mild soft and calcified plaque without stenosis. Similar plaque adjacent to the left vertebral artery origin without stenosis on series 9, image 127. Codominant left vertebral artery is patent to the skull base with no additional plaque. CTA HEAD Posterior circulation: Ectatic distal vertebral arteries and basilar arteries are patent with no plaque or stenosis. Normal PICA origins, SCA and PCA origins. Ectatic basilar tip without discrete aneurysm (series 10, image 23). Posterior communicating arteries are diminutive or absent. Bilateral PCA branches are patent and within normal limits. Anterior  circulation: Both ICA siphons are patent. Both siphons are mildly to moderately ectatic with minimal calcified plaque and no stenosis. Patent carotid termini. Normal MCA and ACA origins. Diminutive anterior communicating artery. Bilateral ACA branches are within normal limits. Left MCA M1 segment bifurcates early without stenosis. Right MCA M1 segment and bifurcation are patent without stenosis. Bilateral MCA branches are stable and within normal limits. Venous sinuses: Early contrast timing. Anatomic variants: None significant. Review of the MIP images confirms the above findings IMPRESSION: 1. Stable CTA Head and Neck with generalized arterial ectasia, tortuosity. Minimal for age atherosclerosis and no arterial stenosis. 2. Stable and normal for age CT appearance of the brain. 3.  Aortic Atherosclerosis (ICD10-I70.0). Electronically Signed   By: Marlise Simpers M.D.   On: 03/13/2024 04:20   DG Chest Portable 1 View Result Date: 03/13/2024 EXAM: 1 VIEW XRAY OF THE CHEST 03/13/2024 03:23:14 AM COMPARISON: 02/24/2024 CLINICAL HISTORY: CP. Pt presents to the ED via POV with complaints of CP that started last night prior to going to bed. Pt states she had a headache last night which she took 500mg  ASA with no relief. She states her heart is racing and doesn't feel right. FINDINGS: LUNGS AND PLEURA: No focal pulmonary opacity. No pulmonary edema. No pleural effusion. No pneumothorax. HEART AND MEDIASTINUM: No acute abnormality of the cardiac and mediastinal silhouettes. BONES AND SOFT TISSUES: No acute osseous abnormality. IMPRESSION: 1. No acute cardiopulmonary abnormality. Electronically signed by: Zadie Herter MD 03/13/2024 03:24 AM EDT RP Workstation: [id number]     PROCEDURES:  Critical Care performed: No     .1-3 Lead EKG Interpretation  Performed by: Jacynda Brunke, Clover Dao, DO Authorized by: Monroe Toure, Clover Dao, DO     Interpretation: normal     ECG rate:  81   ECG rate assessment: normal     Rhythm:  sinus rhythm     Ectopy: none     Conduction: normal       IMPRESSION / MDM / ASSESSMENT AND PLAN / ED COURSE  I reviewed the triage vital signs and the nursing notes.    Patient here with complaints of headache, neck pain, chest pain, feeling like she is going to pass out.  The patient is on the cardiac monitor to evaluate for evidence of arrhythmia and/or significant heart rate changes.   DIFFERENTIAL DIAGNOSIS (includes but not limited to):   ACS, arrhythmia, PE, dissection, intracranial hemorrhage, less likely stroke, doubt meningitis.  Panic attack, anxiety also high on the differential.  Recent labs show no anemia, electrolyte derangement, thyroid dysfunction.   Patient's presentation is most consistent with acute presentation with potential threat to life or bodily function.   PLAN: Will obtain cardiac labs.  EKG nonischemic.  Will obtain CTA of the head, neck as well as the chest, abdomen pelvis to rule out dissection given prior history of carotid artery dissection in 2014.  Very difficult to get a  full neuroexam on her due to her level of anxiousness and discomfort.  Low suspicion currently for stroke but will reassess after morphine and CT imaging.   MEDICATIONS GIVEN IN ED: Medications  sodium chloride  0.9 % bolus 1,000 mL (0 mLs Intravenous Stopped 03/13/24 0436)  morphine (PF) 4 MG/ML injection 4 mg (4 mg Intravenous Given 03/13/24 0343)  ondansetron  (ZOFRAN ) injection 4 mg (4 mg Intravenous Given 03/13/24 0344)  iohexol  (OMNIPAQUE ) 350 MG/ML injection 125 mL (100 mLs Intravenous Contrast Given 03/13/24 0352)  potassium chloride SA (KLOR-CON M) CR tablet 40 mEq (40 mEq Oral Given 03/13/24 0433)  ketorolac (TORADOL) 30 MG/ML injection 15 mg (15 mg Intravenous Given 03/13/24 0458)  gadobutrol  (GADAVIST ) 1 MMOL/ML injection 8 mL (7 mLs Intravenous Contrast Given 03/13/24 0535)     ED COURSE: Patient reports chest pain is improved but still complaining of severe headache.   Will give Toradol.  She did take aspirin this evening.  She states that she does not have anyone to take her home if she is discharged.  Will try to avoid any further sedatives.  It does look like her neurologist wanted to get an MRI of her brain with and without contrast.  Discussed given severe headache and complaints of intermittent numbness in the left arm and face, we can go ahead and obtain the MRI here and she is very appreciative of this.  It also looks like they wanted to get an MRI of the trigeminal nerve but I did discuss with her that this would still need to be done outpatient as there is no emergent reason to obtain this from the ED.    Her first troponin is negative.  Her second is pending.  Normal hemoglobin.  Potassium slightly low at 3.3.  Will replace.    CT scans reviewed and interpreted by myself and the radiologist.  CT of the head, neck is normal.  She does have a 4.2 cm ascending thoracic aortic aneurysm without rupture or dissection.  Have discussed this with patient and discussed the recommendations of yearly imaging to ensure no change.  She verbalized understanding.   6:20 AM  Pt's MRI brain with and without contrast shows no acute abnormality.  Second troponin negative.  Patient reports significant improvement with chest pain, headache, neck pain with Toradol.  Recommended continued close follow-up with her outpatient doctors.  I feel she is safe for discharge and she is comfortable with this plan as well.  Recommended Tylenol , Motrin over-the-counter as needed.  Normal magnesium.  Normal TSH.  At this time, I do not feel there is any life-threatening condition present. I reviewed all nursing notes, vitals, pertinent previous records.  All lab and urine results, EKGs, imaging ordered have been independently reviewed and interpreted by myself.  I reviewed all available radiology reports from any imaging ordered this visit.  Based on my assessment, I feel the patient is safe to  be discharged home without further emergent workup and can continue workup as an outpatient as needed. Discussed all findings, treatment plan as well as usual and customary return precautions.  They verbalize understanding and are comfortable with this plan.  Outpatient follow-up has been provided as needed.  All questions have been answered.   CONSULTS: Admission considered but symptoms seem atypical for ACS and she has had reassuring workup here today as well as previous reassuring workups x 2 in June 2025 in the ED.  She has outpatient follow-up with both cardiology and neurology.   OUTSIDE  RECORDS REVIEWED: Reviewed recent cardiology and neurology notes on 03/04/2024.       FINAL CLINICAL IMPRESSION(S) / ED DIAGNOSES   Final diagnoses:  Chest pain, unspecified type  Neck pain  Palpitations  Near syncope  Aneurysm of ascending aorta without rupture (HCC)  Left sided numbness     Rx / DC Orders   ED Discharge Orders     None        Note:  This document was prepared using Dragon voice recognition software and may include unintentional dictation errors.   Alvera Tourigny, Clover Dao, DO 03/13/24 [phone]

## 2024-03-13 NOTE — Discharge Instructions (Signed)
 You may alternate over the counter Tylenol  1000 mg every 6 hours as needed for pain, fever and Ibuprofen 800 mg every 6-8 hours as needed for pain, fever.  Please take Ibuprofen with food.  Do not take more than 4000 mg of Tylenol  (acetaminophen ) in a 24 hour period.   Please continue to follow-up with your cardiologist, neurologist as previously scheduled.   Your cardiac labs, EKG, CT scans of your head, neck, chest, abdomen pelvis were reassuring other than a 4.2 cm thoracic aortic aneurysm.  We recommend follow-up with your primary care doctor to have annual imaging to evaluate for stability.  MRI of your brain showed no acute abnormality including stroke, mass, bleeding.

## 2024-03-14 ENCOUNTER — Ambulatory Visit
Admission: RE | Admit: 2024-03-14 | Discharge: 2024-03-14 | Disposition: A | Discharge status: Home / Self Care | Source: Ambulatory Visit

## 2024-03-14 DIAGNOSIS — R6884 Jaw pain: Secondary | ICD-10-CM | POA: Insufficient documentation

## 2024-03-14 DIAGNOSIS — M8788 Other osteonecrosis, other site: Secondary | ICD-10-CM | POA: Diagnosis present

## 2024-03-14 DIAGNOSIS — G500 Trigeminal neuralgia: Secondary | ICD-10-CM | POA: Insufficient documentation

## 2024-03-14 LAB — URINE CULTURE: Culture: <10000 — AB

## 2024-03-14 MED ORDER — GADOBUTROL 1 MMOL/ML IV SOLN
7.5000 mL | Freq: Once | INTRAVENOUS | Status: AC | PRN
  Administered 2024-03-14: 7.5 mL via INTRAVENOUS

## 2024-04-14 ENCOUNTER — Ambulatory Visit (INDEPENDENT_AMBULATORY_CARE_PROVIDER_SITE_OTHER): Admitting: Clinical

## 2024-04-14 DIAGNOSIS — F439 Reaction to severe stress, unspecified: Secondary | ICD-10-CM

## 2024-04-14 DIAGNOSIS — F4323 Adjustment disorder with mixed anxiety and depressed mood: Secondary | ICD-10-CM | POA: Diagnosis not present
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
## 2024-04-14 NOTE — Progress Notes (Signed)
   Doree Barthel, LCSW

## 2024-04-16 IMAGING — CT CT HEAD W/O CM
4 series · 16 of 47 positions shown, 18 images · non-contrast
Comparison: None Available.

CLINICAL DATA: numbness Pt come with c/o facial numbness that
started on [REDACTED]. Pt states it was left sided and now right side
too. Pt states pain at base of neck. Pt states she just feels off.
Pt denies any blurry vision or dizziness .



[Series 2: head wo · axial · 0.42mm/px · z∈[-58,+62]mm · 7 of 33 slices shown, 9 images]
[im 5/33  brain]
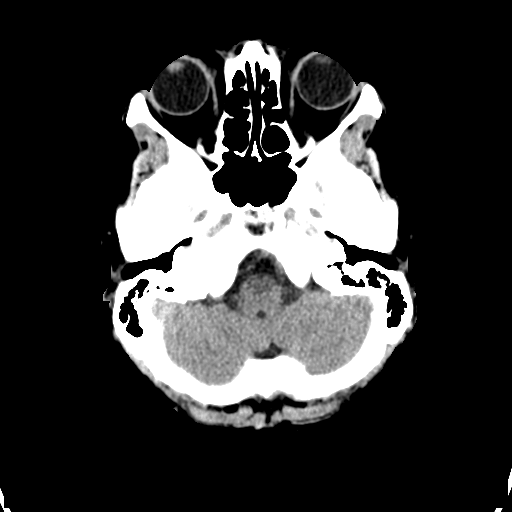
[im 5/33  bone]
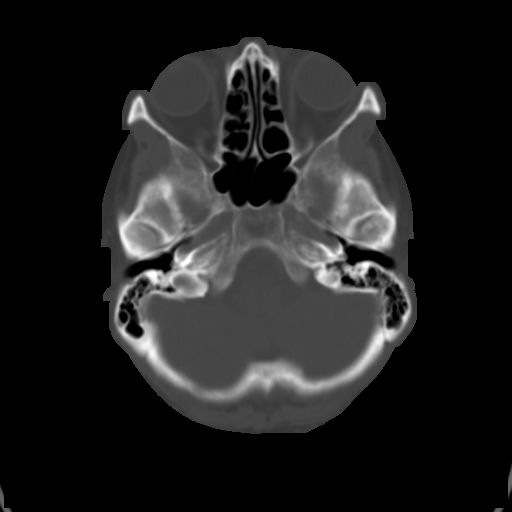
[im 9/33  brain]
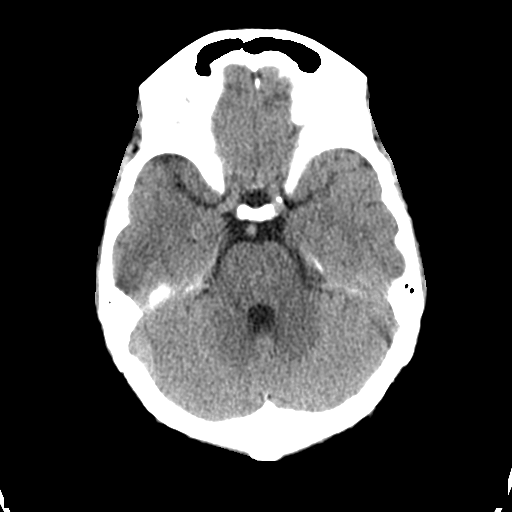
[im 13/33  brain]
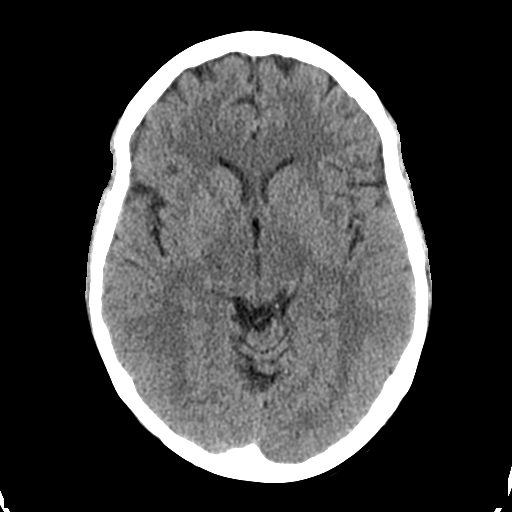
[im 17/33  brain]
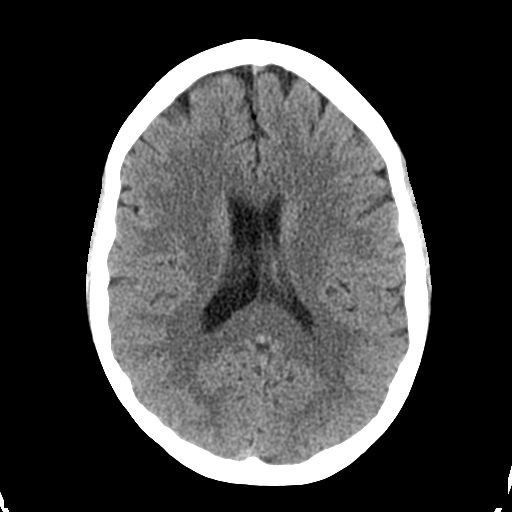
[im 21/33  brain]
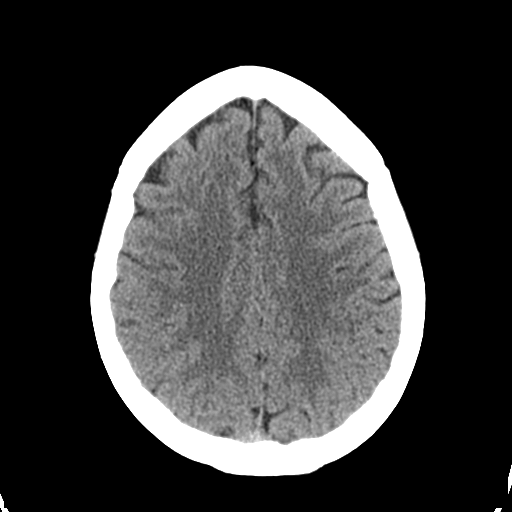
[im 21/33  bone]
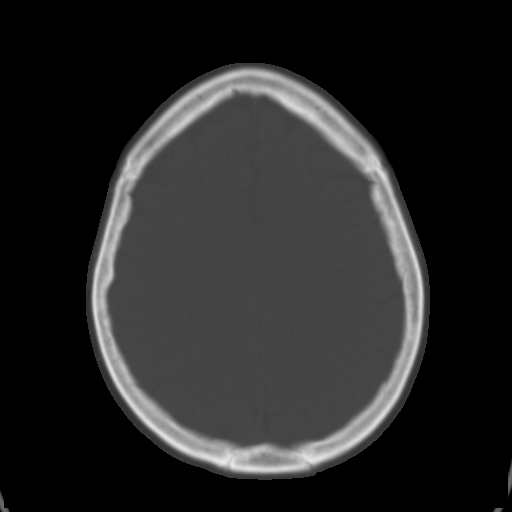
[im 25/33  brain]
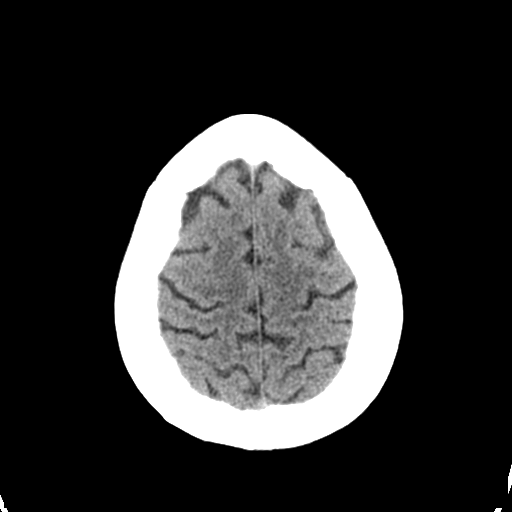
[im 29/33  brain]
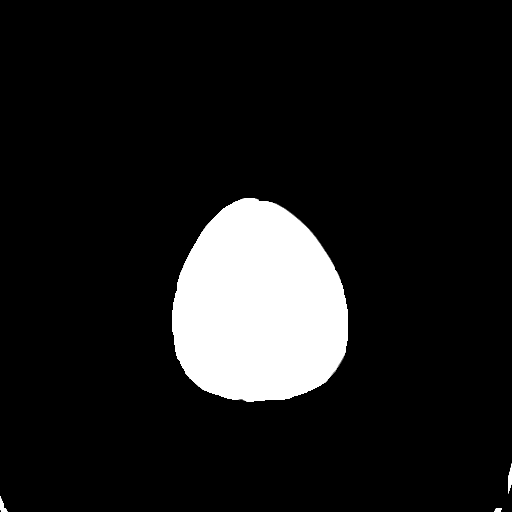

[Series 3: head bone · axial · 0.42mm/px · z∈[-62,-30]mm · 3 of 81 slices shown]
[im 9/81  bone]
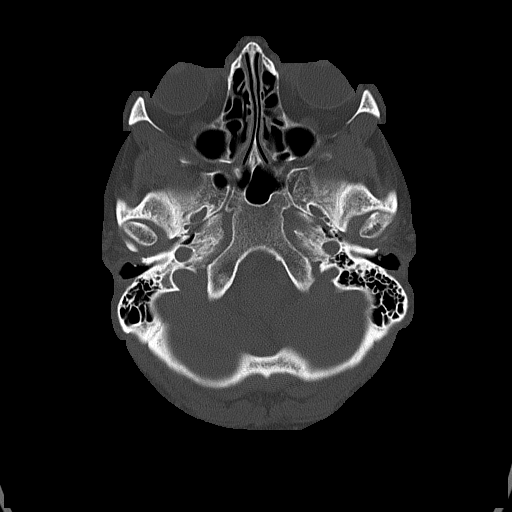
[im 17/81  bone]
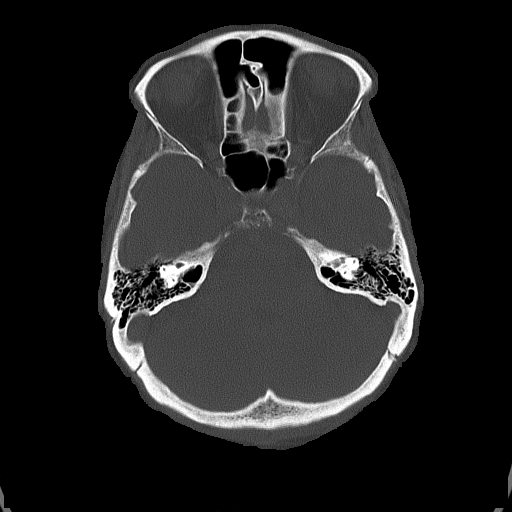
[im 25/81  bone]
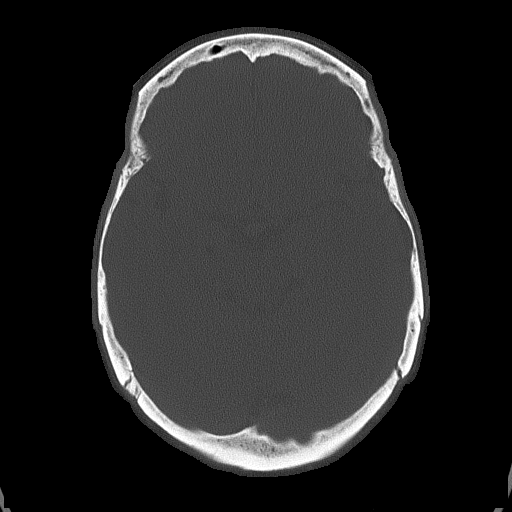

[Series 4: coronal soft tissue · coronal · 0.30mm/px · 3 of 67 slices shown]
[im 23/67  brain]
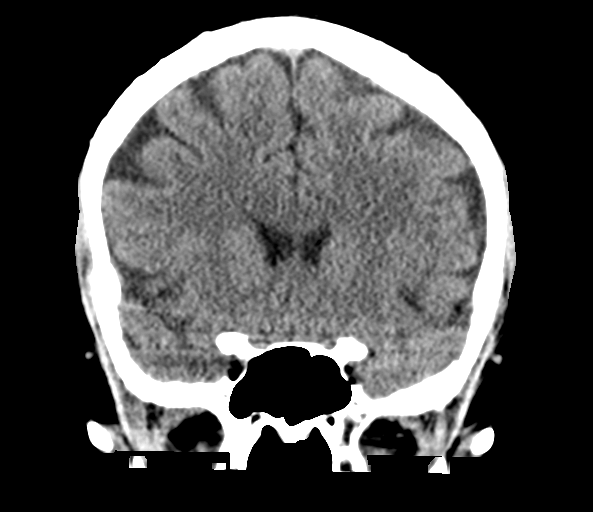
[im 30/67  brain]
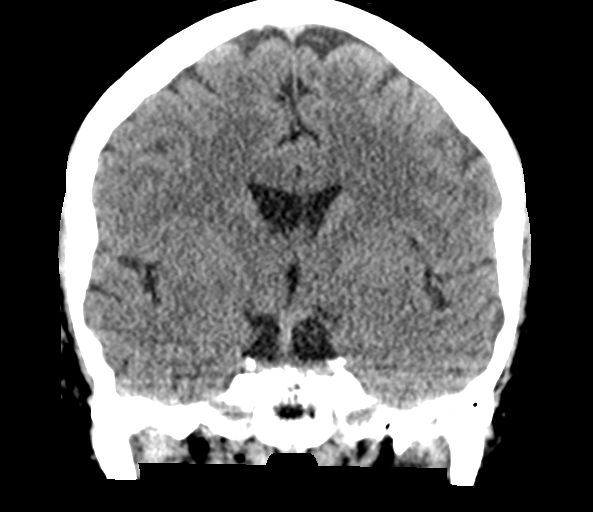
[im 37/67  brain]
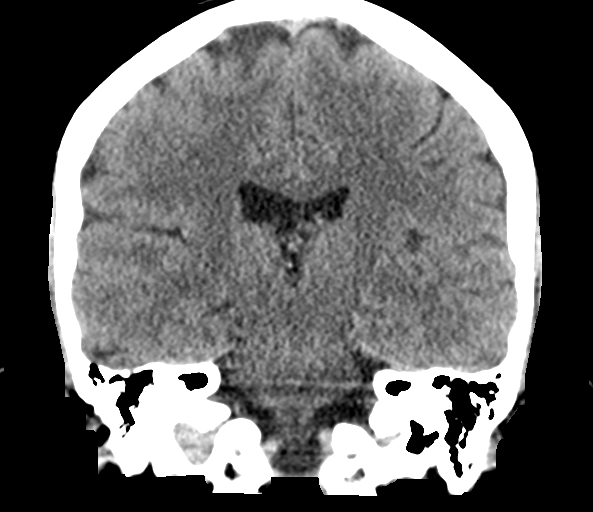

[Series 5: sagittal soft tissue · sagittal · 0.30mm/px · 3 of 59 slices shown]
[im 20/59  brain]
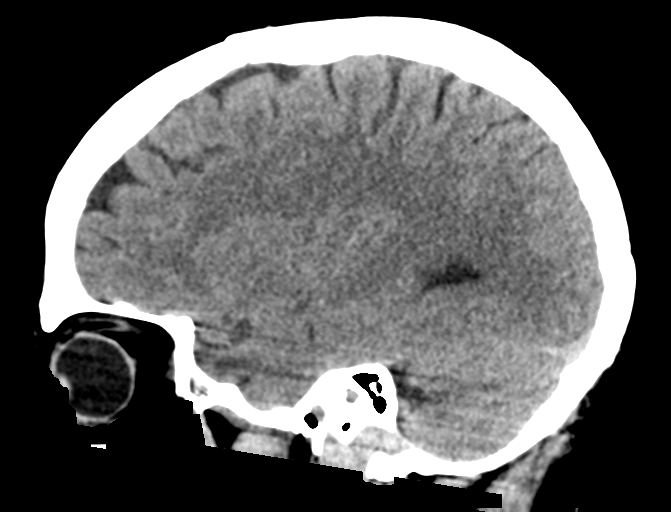
[im 30/59  brain]
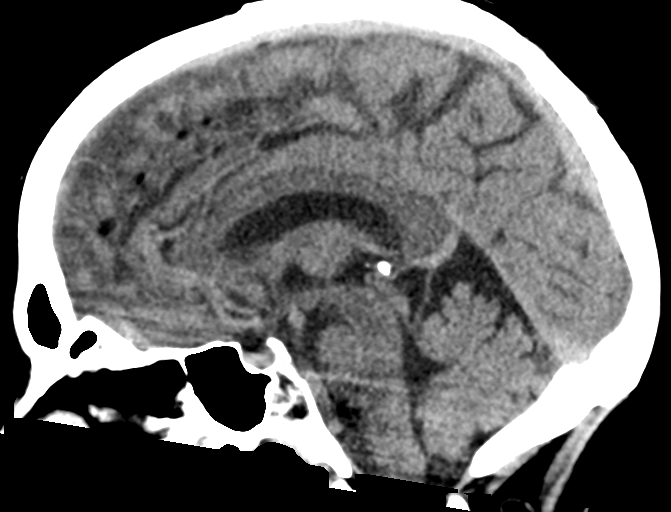
[im 39/59  brain]
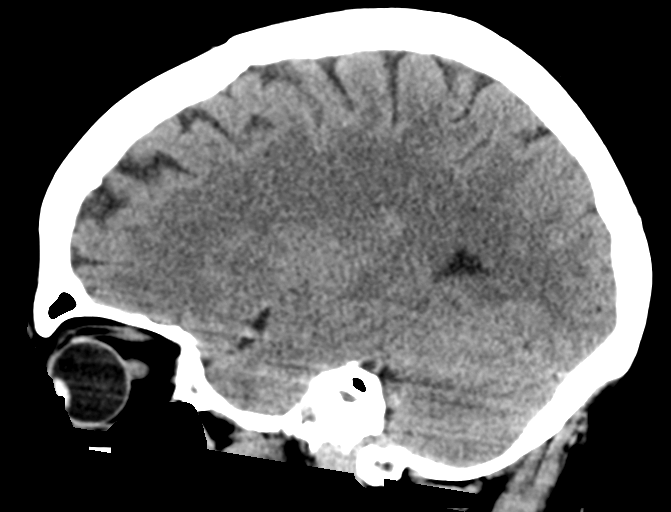

[16 of 47 positions shown; findings below may reference images not displayed]

FINDINGS: Brain:

No evidence of large-territorial acute infarction. No parenchymal
hemorrhage. No mass lesion. No extra-axial collection.

No mass effect or midline shift. No hydrocephalus. Basilar cisterns
are patent.

Vascular: No hyperdense vessel.

Skull: No acute fracture or focal lesion.

Sinuses/Orbits: Paranasal sinuses and mastoid air cells are clear.
The orbits are unremarkable.

Other: None.
IMPRESSION: No acute intracranial abnormality.

## 2024-04-16 IMAGING — MR MR HEAD W/O CM
11 series · 39 of 48 positions shown · IV contrast (gadavist)
Comparison: CT from earlier the same day.

CLINICAL DATA: Initial evaluation for neuro deficit, stroke
suspected.

EXAM:
MRI HEAD WITHOUT CONTRAST
MRA HEAD WITHOUT CONTRAST
MRA NECK WITHOUT AND WITH CONTRAST
TECHNIQUE: Multiplanar, multi-echo pulse sequences of the brain and surrounding
structures were acquired without intravenous contrast. Angiographic
images of the Circle of Willis were acquired using MRA technique
without intravenous contrast. Angiographic images of the neck were
acquired using MRA technique without and with intravenous contrast.
Carotid stenosis measurements (when applicable) are obtained
utilizing NASCET criteria, using the distal internal carotid
diameter as the denominator.
CONTRAST:  9mL GADAVIST GADOBUTROL 1 MMOL/ML IV SOLN

[Series 5: ax dwi_tracew · axial · 3.0mm · 0.65mm/px · z∈[-86,+67]mm · 5 of 48 slices shown]
[im 1/48]
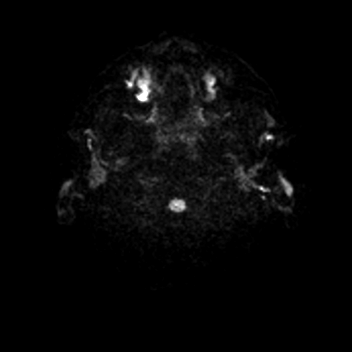
[im 12/48]
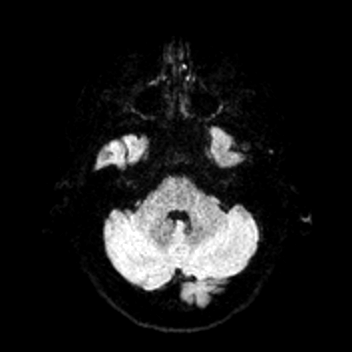
[im 24/48]
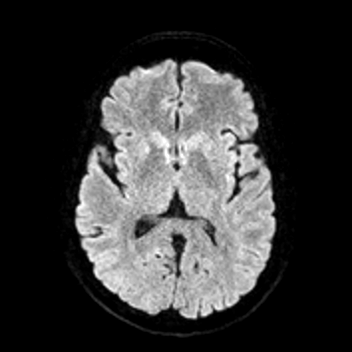
[im 36/48]
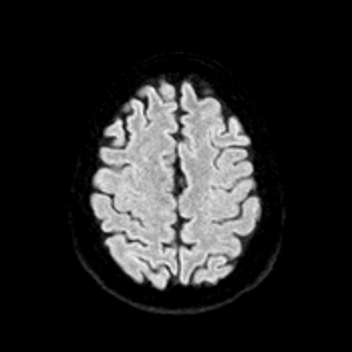
[im 48/48]
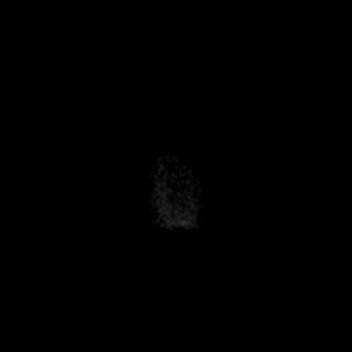

[Series 6: ax dwi_adc · axial · 3.0mm · 0.65mm/px · z∈[-86,+67]mm · 4 of 48 slices shown]
[im 1/48]
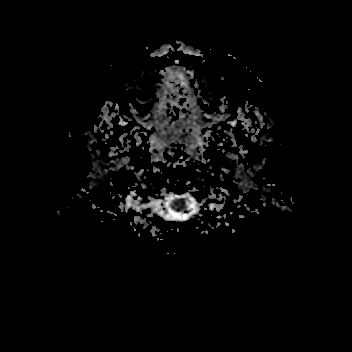
[im 16/48]
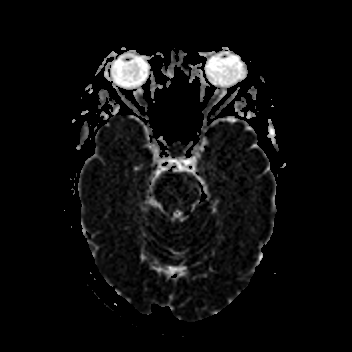
[im 32/48]
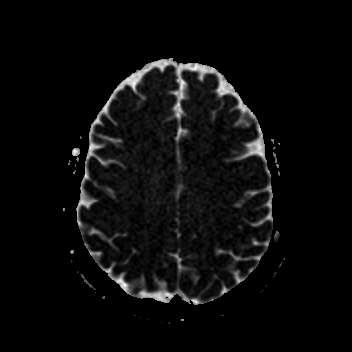
[im 48/48]
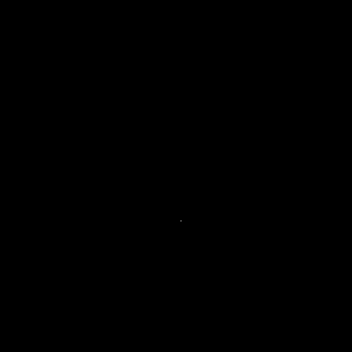

[Series 7: cor dwi_tracew · coronal · 5.0mm · 0.65mm/px · 3 of 40 slices shown]
[im 1/40]
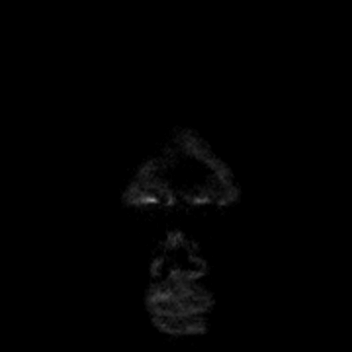
[im 20/40]
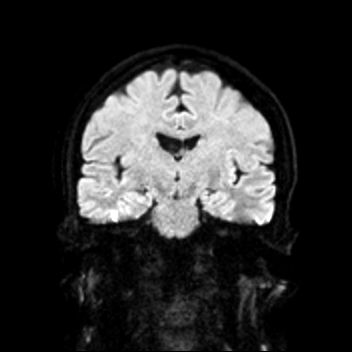
[im 40/40]
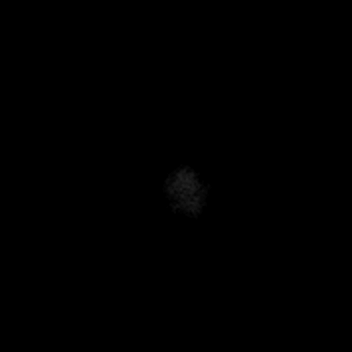

[Series 8: cor dwi_adc · coronal · 5.0mm · 0.65mm/px · 3 of 37 slices shown]
[im 1/37]
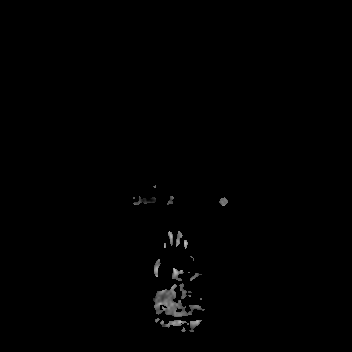
[im 19/37]
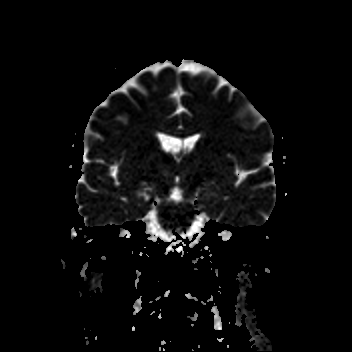
[im 37/37]
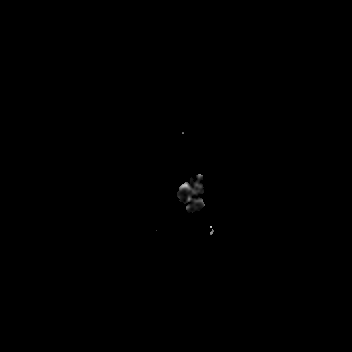

[Series 9: T1 · sagittal · 5.0mm · 0.62mm/px · 2 of 24 slices shown (1 of 2)]
[im 1/24]
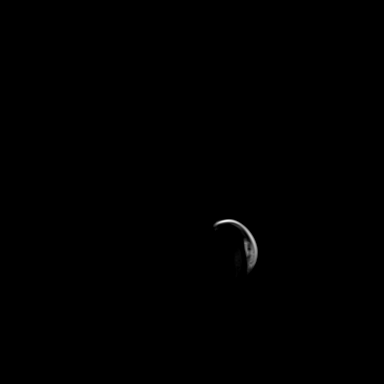
[im 24/24]
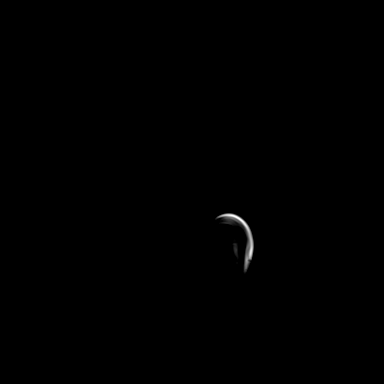

[Series 10: T2 · axial · 5.0mm · 0.53mm/px · z∈[-82,+71]mm · 2 of 27 slices shown (1 of 2)]
[im 1/27]
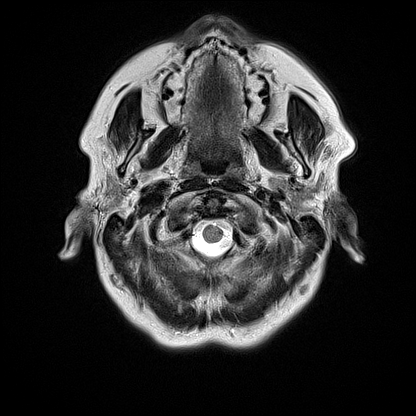
[im 27/27]
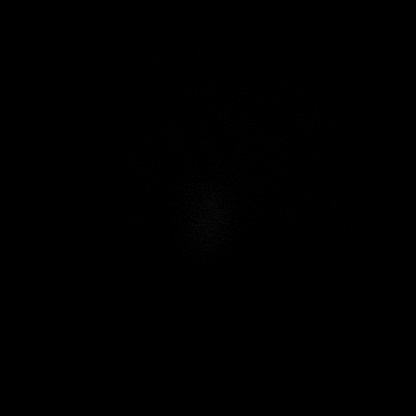

[Series 12: pha_images · axial · 3.0mm · 0.90mm/px · z∈[-93,+72]mm · 4 of 56 slices shown]
[im 1/56]
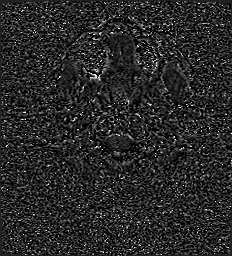
[im 19/56]
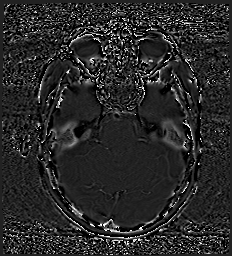
[im 37/56]
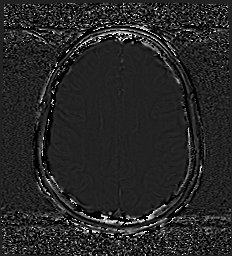
[im 56/56]
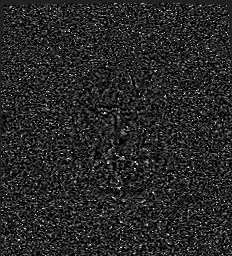

[Series 13: swi_images · axial · 3.0mm · 0.90mm/px · z∈[-93,-52]mm · 2 of 60 slices shown]
[im 1/60]
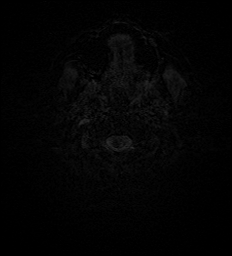
[im 15/60]
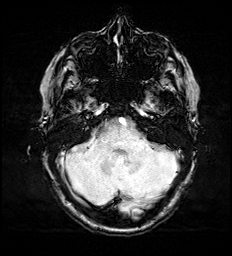

[Series 15: FLAIR · axial · 3.0mm · 0.53mm/px · z∈[-85,+74]mm · 4 of 55 slices shown]
[im 1/55]
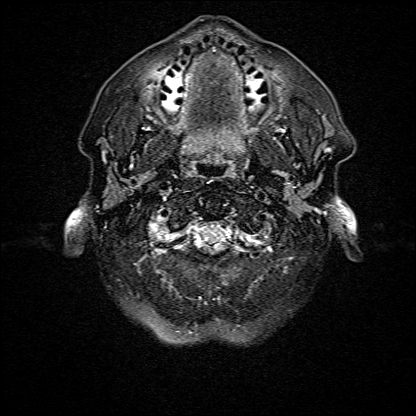
[im 19/55]
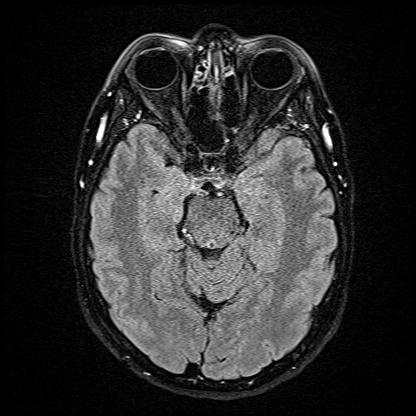
[im 37/55]
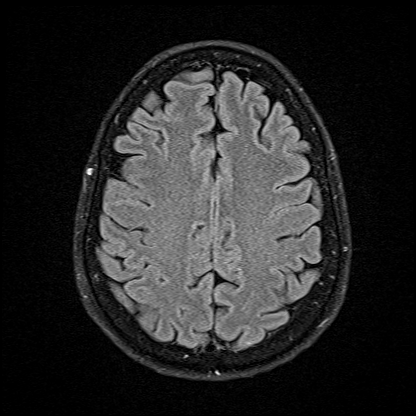
[im 55/55]
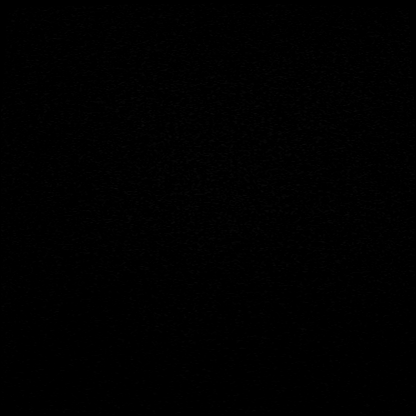

[Series 16: T1 · axial · 1.0mm · 0.98mm/px · z∈[-92,+80]mm · 8 of 176 slices shown (2 of 2)]
[im 1/176]
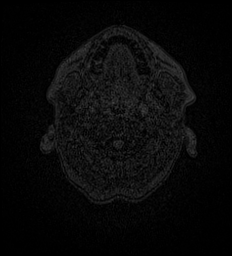
[im 27/176]
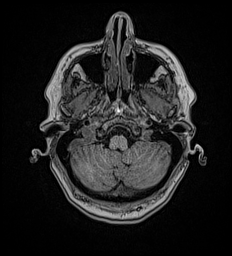
[im 54/176]
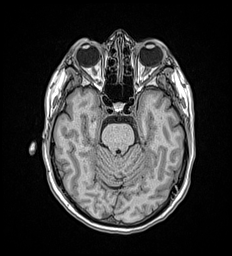
[im 81/176]
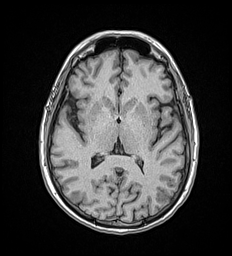
[im 95/176]
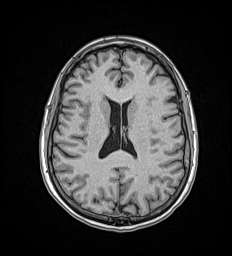
[im 122/176]
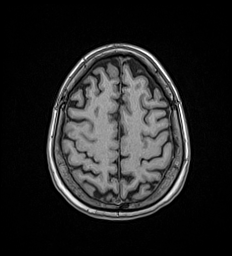
[im 149/176]
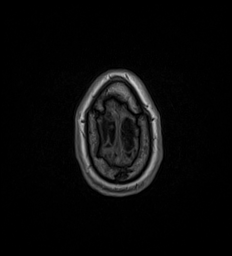
[im 176/176]
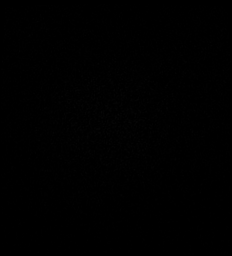

[Series 17: T2 · coronal · 5.0mm · 0.57mm/px · 2 of 31 slices shown (2 of 2)]
[im 1/31]
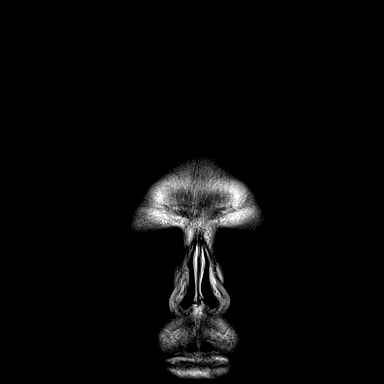
[im 31/31]
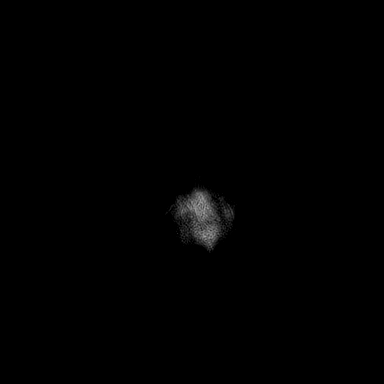

[39 of 48 positions shown; findings below may reference images not displayed]

FINDINGS: MRI HEAD FINDINGS

Brain: Cerebral volume within normal limits. Few scattered
subcentimeter T2/FLAIR hyperintensities noted involving the
supratentorial cerebral white matter, nonspecific, but overall
minimal in nature, and felt to be within normal limits for age.

No evidence for acute or subacute infarct. Gray-white matter
differentiation maintained. No areas of chronic cortical infarction.
No acute or chronic intracranial blood products.

No mass lesion, midline shift or mass effect. No hydrocephalus or
extra-axial fluid collection. Pituitary gland suprasellar region
normal. Midline structures intact and normally formed.

Vascular: Major intracranial vascular flow voids are maintained.

Skull and upper cervical spine: Craniocervical junction within
normal limits. Bone marrow signal intensity normal. No scalp soft
tissue abnormality.

Sinuses/Orbits: Globes and orbital soft tissues within normal
limits. Scattered mucosal thickening noted about the ethmoidal air
cells. Paranasal sinuses are otherwise clear. No mastoid effusion.

Other: None.

MRA HEAD FINDINGS

Anterior circulation: Visualized distal cervical segments of the
internal carotid arteries are widely patent with antegrade flow.
Distal cervical right ICA tortuous. Petrous, cavernous, and
supraclinoid segments patent without stenosis or other abnormality.
A1 segments widely patent. Normal anterior communicating artery
complex. Anterior cerebral arteries widely patent. Normal in
stenosis or occlusion. Left M1 bifurcates early. No proximal MCA
branch occlusion. Distal MCA branches perfused and symmetric.

Posterior circulation: Both V4 segments patent to the
vertebrobasilar junction without stenosis. Both PICA origins patent
and normal. Basilar widely patent to its distal aspect without
stenosis. Superior cerebellar arteries patent bilaterally. Both PCAs
primarily supplied via the basilar and are well perfused to there
distal aspects.

Anatomic variants: None significant.  No aneurysm.

MRA NECK FINDINGS

Aortic arch: Visualized aortic arch normal in caliber with normal 3
vessel morphology. No stenosis about the origin of the great
vessels.

Right carotid system: Right common and internal carotid arteries
widely patent without stenosis, dissection or occlusion.

Left carotid system: Left common and internal carotid arteries
widely patent without stenosis, dissection or occlusion.

Vertebral arteries: Both vertebral arteries arise from the
subclavian arteries. No proximal subclavian artery stenosis. Both
vertebral arteries widely patent without stenosis, dissection or
occlusion.

Other: None
IMPRESSION: 1. Normal brain MRI for age. No acute intracranial abnormality
identified.
2. Normal intracranial MRA.
3. Normal MRA of the neck.

## 2024-04-16 IMAGING — MR MR MRA NECK WO/W CM
1 of 2 series · 20 of 48 positions shown · IV contrast (gadavist)
Comparison: CT from earlier the same day.

CLINICAL DATA: Initial evaluation for neuro deficit, stroke
suspected.

EXAM:
MRI HEAD WITHOUT CONTRAST
MRA HEAD WITHOUT CONTRAST
MRA NECK WITHOUT AND WITH CONTRAST
TECHNIQUE: Multiplanar, multi-echo pulse sequences of the brain and surrounding
structures were acquired without intravenous contrast. Angiographic
images of the Circle of Willis were acquired using MRA technique
without intravenous contrast. Angiographic images of the neck were
acquired using MRA technique without and with intravenous contrast.
Carotid stenosis measurements (when applicable) are obtained
utilizing NASCET criteria, using the distal internal carotid
diameter as the denominator.
CONTRAST:  9mL GADAVIST GADOBUTROL 1 MMOL/ML IV SOLN

[Series 13: angio_fl3d_cor_post_ttc=2.0s_moco-adv_sub · coronal · 0.9mm · 0.85mm/px · 20 of 96 slices shown]
[im 1/96]
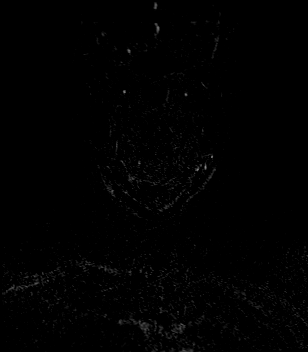
[im 6/96]
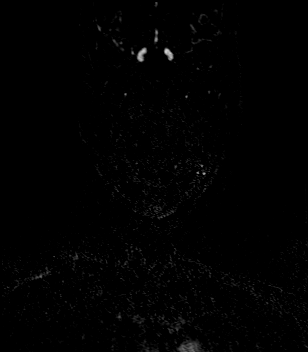
[im 11/96]
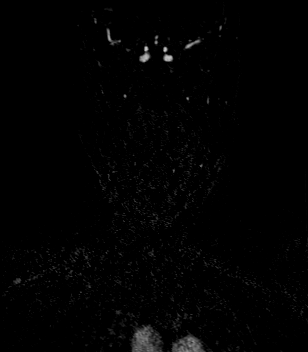
[im 16/96]
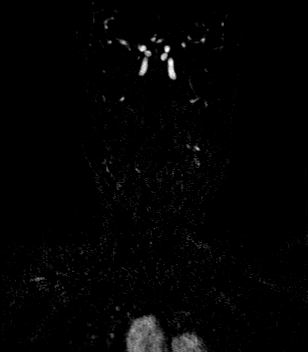
[im 21/96]
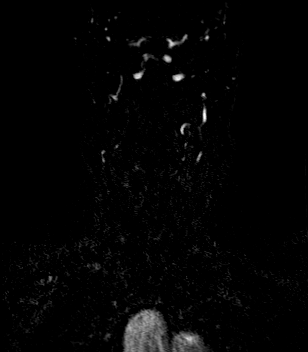
[im 26/96]
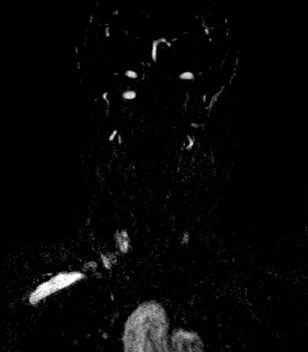
[im 31/96]
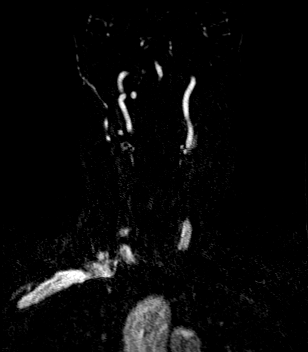
[im 36/96]
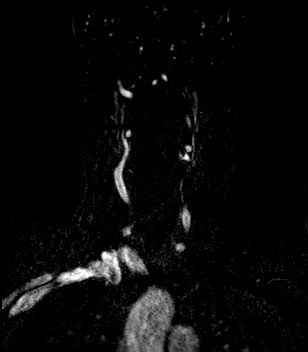
[im 41/96]
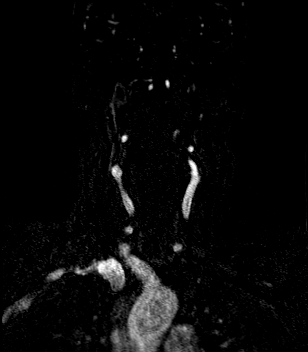
[im 46/96]
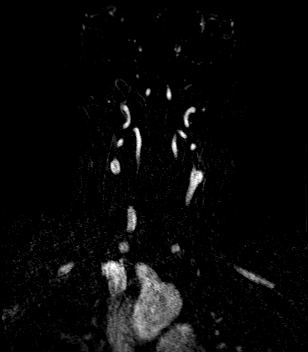
[im 51/96]
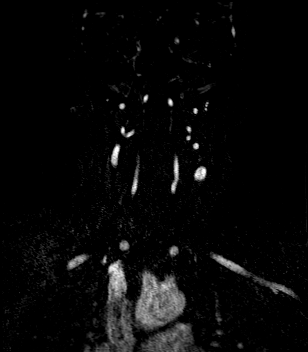
[im 56/96]
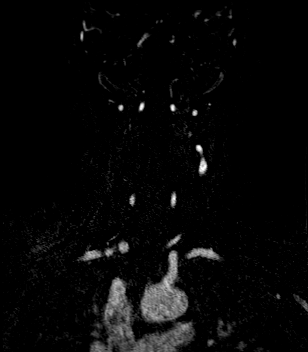
[im 61/96]
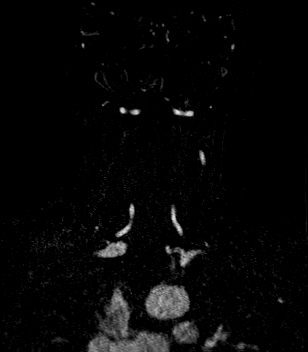
[im 66/96]
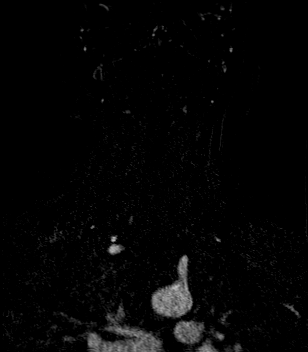
[im 71/96]
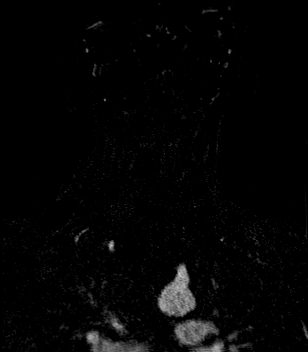
[im 76/96]
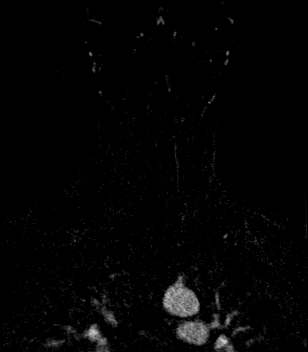
[im 81/96]
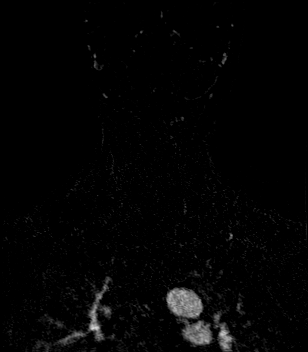
[im 86/96]
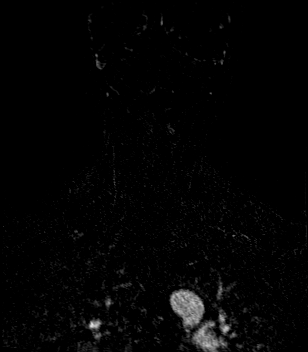
[im 91/96]
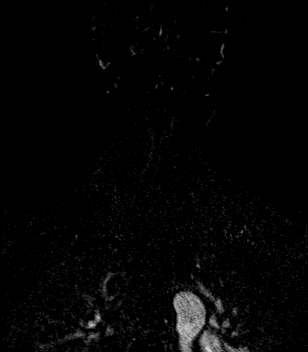
[im 96/96]
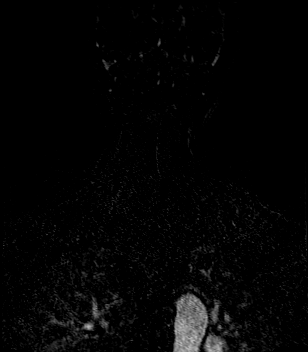

[20 of 48 positions shown; findings below may reference images not displayed]

FINDINGS: MRI HEAD FINDINGS

Brain: Cerebral volume within normal limits. Few scattered
subcentimeter T2/FLAIR hyperintensities noted involving the
supratentorial cerebral white matter, nonspecific, but overall
minimal in nature, and felt to be within normal limits for age.

No evidence for acute or subacute infarct. Gray-white matter
differentiation maintained. No areas of chronic cortical infarction.
No acute or chronic intracranial blood products.

No mass lesion, midline shift or mass effect. No hydrocephalus or
extra-axial fluid collection. Pituitary gland suprasellar region
normal. Midline structures intact and normally formed.

Vascular: Major intracranial vascular flow voids are maintained.

Skull and upper cervical spine: Craniocervical junction within
normal limits. Bone marrow signal intensity normal. No scalp soft
tissue abnormality.

Sinuses/Orbits: Globes and orbital soft tissues within normal
limits. Scattered mucosal thickening noted about the ethmoidal air
cells. Paranasal sinuses are otherwise clear. No mastoid effusion.

Other: None.

MRA HEAD FINDINGS

Anterior circulation: Visualized distal cervical segments of the
internal carotid arteries are widely patent with antegrade flow.
Distal cervical right ICA tortuous. Petrous, cavernous, and
supraclinoid segments patent without stenosis or other abnormality.
A1 segments widely patent. Normal anterior communicating artery
complex. Anterior cerebral arteries widely patent. Normal in
stenosis or occlusion. Left M1 bifurcates early. No proximal MCA
branch occlusion. Distal MCA branches perfused and symmetric.

Posterior circulation: Both V4 segments patent to the
vertebrobasilar junction without stenosis. Both PICA origins patent
and normal. Basilar widely patent to its distal aspect without
stenosis. Superior cerebellar arteries patent bilaterally. Both PCAs
primarily supplied via the basilar and are well perfused to there
distal aspects.

Anatomic variants: None significant.  No aneurysm.

MRA NECK FINDINGS

Aortic arch: Visualized aortic arch normal in caliber with normal 3
vessel morphology. No stenosis about the origin of the great
vessels.

Right carotid system: Right common and internal carotid arteries
widely patent without stenosis, dissection or occlusion.

Left carotid system: Left common and internal carotid arteries
widely patent without stenosis, dissection or occlusion.

Vertebral arteries: Both vertebral arteries arise from the
subclavian arteries. No proximal subclavian artery stenosis. Both
vertebral arteries widely patent without stenosis, dissection or
occlusion.

Other: None
IMPRESSION: 1. Normal brain MRI for age. No acute intracranial abnormality
identified.
2. Normal intracranial MRA.
3. Normal MRA of the neck.

## 2024-04-22 ENCOUNTER — Other Ambulatory Visit: Payer: Self-pay | Admitting: Student

## 2024-04-22 DIAGNOSIS — Z1231 Encounter for screening mammogram for malignant neoplasm of breast: Secondary | ICD-10-CM

## 2024-04-25 ENCOUNTER — Encounter: Payer: Self-pay | Admitting: Clinical

## 2024-04-25 ENCOUNTER — Ambulatory Visit: Admitting: Clinical

## 2024-04-25 DIAGNOSIS — F4323 Adjustment disorder with mixed anxiety and depressed mood: Secondary | ICD-10-CM

## 2024-04-25 DIAGNOSIS — F439 Reaction to severe stress, unspecified: Secondary | ICD-10-CM

## 2024-04-25 NOTE — Progress Notes (Signed)
 [facility] Counselor/Therapist Progress Note  Patient ID: Cheryl Dunlap, MRN: [id number],    Date: 04/25/2024  Time Spent: 3:35pm - 4:33pm : 58 minutes   Treatment Type: Individual Therapy  Reported Symptoms: difficulty staying asleep and returning to sleep, anxiety and panic at times  Mental Status Exam: Appearance:  Neat and Well Groomed     Behavior: Appropriate  Motor: Normal  Speech/Language:  Clear and Coherent and Normal Rate  Affect: Appropriate  Mood: Patient stated, fairly level today  Thought process: normal  Thought content:   WNL  Sensory/Perceptual disturbances:   WNL  Orientation: oriented to person, place, time/date, and situation  Attention: Good  Concentration: Good  Memory: WNL  Fund of knowledge:  Good  Insight:   Good  Judgment:  Good  Impulse Control: Good   Risk Assessment: Danger to Self:  No Patient denied current suicidal ideation  Self-injurious Behavior: No Danger to Others: No Patient denied current homicidal ideation Duty to Warn:no Physical Aggression / Violence:No  Access to Firearms a concern: No  Gang Involvement:No   Subjective: Patient reported patient had an appointment with patient's PCP yesterday and PCP prescribed trazodone. Patient reported concern regarding combining Prozac and trazodone. Patient reported patient has been on 10 mg of Prozac for 30 days and dosage is being adjusted. Patient stated, I have some panic here and there still and stated, yesterday I was more panicky. Patient reported feeling the loss of patient's dog triggered symptoms. Patient stated, It brings back the PTSD that I have and it brings up the panic in reference to recent loss. Patient stated, It brought back that cascade of those feelings and it just makes the panic more in reference to recent loss. Patient stated, I put things away I compartmentalize and it comes back to haunt me. Patient started, It sounds close enough in response  to diagnoses. Patient stated,  sure id be happy to listen to what the recommendations would be in response to consultation with a psychiatrist. Patient stated, I don't know in response to CBTI and patient reported she refers to follow up with psychiatrist prior to considering CBTI. Patient stated, I think that is resolved, that stuff way back that's resolved in my brain in reference to trauma. Patient stated, feel better, not go into the panic cascade, be more open and into the world in response to goals. Patient stated, be integrated into the community in response to goals.   Interventions: Motivational Interviewing. Clinician conducted session via caregility video from clinician's home office. Patient provided verbal consent to proceed with telehealth session and is aware of limitations of telephone or video visits. Patient participated in session from patient's home. Clinician reviewed diagnoses and treatment recommendations. Provided psycho education related to diagnoses and treatment. Discussed clinician's scope of practice and patient's goals for treatment. Clinician utilized motivational interviewing to explore potential goals for therapy. Clinician utilized a task centered approach in collaboration with patient to develop goals for therapy. Patient participated in development of goals and agreed to goals for therapy.   Collaboration of Care: Psychiatrist AEB Discussed consent for referral to a psychiatrist  Patient/Guardian was advised Release of Information must be obtained prior to any record release in order to collaborate their care with an outside provider. Patient/Guardian was advised if they have not already done so to contact [facility] to sign all necessary forms in order for us  to release information regarding their care.   Consent: Patient/Guardian gives verbal consent for treatment and  assignment of benefits for services provided during this visit.  Patient/Guardian expressed understanding and agreed to proceed.   Diagnosis:Adjustment disorder with mixed anxiety and depressed mood  Trauma and stressor-related disorder  Plan: Patient is to utilize [employer], relaxation techniques, mindfulness, behavioral activation, and coping strategies to decrease symptoms associated with their diagnosis. Frequency: bi-weekly  Modality: individual     Long-term goal:   Reduce overall level, frequency, and intensity of the feelings of depression, anxiety and panic as evidenced by decrease in depressed mood, worry, anxiety,  difficulty staying asleep and returning to sleep, decreased concentration, decreased motivation, decreased energy from 7 days/week to 0 days/week per patient report for at least 3 consecutive months. Target Date: 04/25/25  Progress: established 05/15/24   [place]-term goal:  decrease symptoms of anxiety from 3 days per week to 0 days per week by developing and implementing healthy coping mechanisms Target Date: 04/25/25  Progress: established 05/15/24   Increase patient's participation in social opportunities from 2 times per week to 5 times per week  Target Date: 04/25/25  Progress: established 05/15/24   Develop a plan for patient's care in the future Target Date: 04/25/25  Progress: established 05/15/24        Darice Seats, LCSW

## 2024-04-25 NOTE — Progress Notes (Signed)
   Doree Barthel, LCSW

## 2024-05-06 ENCOUNTER — Telehealth: Payer: Self-pay | Admitting: Student

## 2024-05-06 ENCOUNTER — Ambulatory Visit (INDEPENDENT_AMBULATORY_CARE_PROVIDER_SITE_OTHER): Admitting: Clinical

## 2024-05-06 DIAGNOSIS — F439 Reaction to severe stress, unspecified: Secondary | ICD-10-CM | POA: Diagnosis not present

## 2024-05-06 DIAGNOSIS — F4323 Adjustment disorder with mixed anxiety and depressed mood: Secondary | ICD-10-CM | POA: Diagnosis not present

## 2024-05-06 NOTE — Progress Notes (Signed)
   Darice Seats, LCSW

## 2024-05-06 NOTE — Telephone Encounter (Signed)
 Patient was asking for verification that her insurance is covering these visits.  States she never received confirmation of benefit coverage.  For her [facility] visits

## 2024-05-06 NOTE — Progress Notes (Signed)
 [facility] Counselor/Therapist Progress Note  Patient ID: Cheryl Dunlap, MRN: [id number],    Date: 05/06/2024  Time Spent: 2:37pm - 3:28pm : 51 minutes  Treatment Type: Individual Therapy  Reported Symptoms: Patient reported recent feelings of anxiety and difficulty returning to sleep  Mental Status Exam: Appearance:  Neat and Well Groomed     Behavior: Appropriate  Motor: Normal  Speech/Language:  Clear and Coherent and Normal Rate  Affect: Appropriate  Mood: normal  Thought process: normal  Thought content:   WNL  Sensory/Perceptual disturbances:   WNL  Orientation: oriented to person, place, time/date, and situation  Attention: Good  Concentration: Good  Memory: WNL  Fund of knowledge:  Good  Insight:   Good  Judgment:  Good  Impulse Control: Good   Risk Assessment: Danger to Self:  No Patient denied current suicidal ideation  Self-injurious Behavior: No Danger to Others: No Patient denied current homicidal ideation Duty to Warn:no Physical Aggression / Violence:No  Access to Firearms a concern: No  Gang Involvement:No   Subjective: Patient stated, I worked that four hours at [employer] and it went well in response to events since last session. Patient stated, not terrible its better than it has been, just one episode of panic in response to patient's mood since last session. Patient stated, I'm not bad at all in response to current mood. Patient reported patient slept 5 hours last night and reported difficulty staying asleep/returning to sleep. Patient reported napping during the day due to fatigue. Patient stated, I sleep every other night and reported sleeping 3-4 hours then wakes up and can't return to sleep every other night. patient stated, every other night I'm exhausted. Patient reported walking daily, twice per day for 15-30 minutes. Patient reported waking up at various times and goes to bed between 10 pm - 12 am. Patient reported drinking one  caffeine beverage in the morning. Patient reported patient currently uses a CPAP machine. Patient reported recent feelings of panic occurred in the evening and patient stated, I felt a wave of something come over me. Patient reported no identifiable triggers for recent feelings of panic. Patient reported using distraction as a coping strategy and utilizes an app for relaxation/meditation. Patient reported music, pod cast, you tube have not been helpful strategies in the past.   Interventions: Cognitive Behavioral Therapy. Clinician conducted session in person at clinician's office at [facility]. Reviewed events since last session and assessed for changes. Discussed difficulty staying asleep/returning to sleep. Explored patient's sleep schedule/routine/sleep hygiene. Provided psycho education related to healthy sleep hygiene. Discussed recent feelings of panic and assisted patient in exploring triggers for panic/anxiety. Explored coping strategies patient utilizes in response to anxiety. Clinician requested for homework patient complete sleep diary.    Collaboration of Care: not required at this time   Diagnosis:Adjustment disorder with mixed anxiety and depressed mood   Trauma and stressor-related disorder   Plan: Patient is to utilize [employer] Therapy, relaxation techniques, mindfulness, behavioral activation, and coping strategies to decrease symptoms associated with their diagnosis. Frequency: bi-weekly  Modality: individual      Long-term goal:   Reduce overall level, frequency, and intensity of the feelings of depression, anxiety and panic as evidenced by decrease in depressed mood, worry, anxiety,  difficulty staying asleep and returning to sleep, decreased concentration, decreased motivation, decreased energy from 7 days/week to 0 days/week per patient report for at least 3 consecutive months. Target Date: 04/25/25  Progress: progressing    [place]-term goal:  decrease  symptoms of anxiety from 3 days per week to 0 days per week by developing and implementing healthy coping mechanisms Target Date: 04/25/25  Progress: progressing    Increase patient's participation in social opportunities from 2 times per week to 5 times per week  Target Date: 04/25/25  Progress: progressing    Develop a plan for patient's care in the future Target Date: 04/25/25  Progress: progressing     Darice Seats, LCSW

## 2024-05-08 ENCOUNTER — Ambulatory Visit
Admission: RE | Admit: 2024-05-08 | Discharge: 2024-05-08 | Disposition: A | Discharge status: Home / Self Care | Source: Ambulatory Visit | Attending: Student | Admitting: Student

## 2024-05-08 DIAGNOSIS — Z1231 Encounter for screening mammogram for malignant neoplasm of breast: Secondary | ICD-10-CM | POA: Diagnosis present

## 2024-05-27 ENCOUNTER — Ambulatory Visit (INDEPENDENT_AMBULATORY_CARE_PROVIDER_SITE_OTHER): Admitting: Clinical

## 2024-05-27 DIAGNOSIS — F4323 Adjustment disorder with mixed anxiety and depressed mood: Secondary | ICD-10-CM | POA: Diagnosis not present

## 2024-05-27 DIAGNOSIS — F439 Reaction to severe stress, unspecified: Secondary | ICD-10-CM | POA: Diagnosis not present

## 2024-05-27 NOTE — Progress Notes (Signed)
   Cheryl Seats, LCSW

## 2024-05-27 NOTE — Progress Notes (Signed)
 [facility] Counselor/Therapist Progress Note  Patient ID: Cheryl Dunlap, MRN: [id number],    Date: 05/27/2024  Time Spent: 1:35pm - 2:29pm : 54 minutes   Treatment Type: Individual Therapy  Reported Symptoms: difficulty staying asleep, fatigue  Mental Status Exam: Appearance:  Neat and Well Groomed     Behavior: Appropriate  Motor: Normal  Speech/Language:  Clear and Coherent and Normal Rate  Affect: Appropriate  Mood: normal  Thought process: normal  Thought content:   WNL  Sensory/Perceptual disturbances:   WNL  Orientation: oriented to person, place, time/date, and situation  Attention: Good  Concentration: Good  Memory: WNL  Fund of knowledge:  Good  Insight:   Good  Judgment:  Good  Impulse Control: Good   Risk Assessment: Danger to Self:  No Patient denied current suicidal ideation Self-injurious Behavior: No Danger to Others: No Patient denied current homicidal ideation Duty to Warn:no Physical Aggression / Violence:No  Access to Firearms a concern: No  Gang Involvement:No   Subjective: Patient stated, a little better I would say, I don't feel the panic stuff coming on in reference to symptoms since last session. Patient reported recent feelings of panic lasted 5 minutes. Patient stated, other than being so tired, seems like I'm groggy a lot in response to mood since last session. Patient stated, its not bad, its neutral in response to current mood. Patient reported patient has been participating in virtual campfires at night. Patient reported patient has an appointment with a PA at a psychiatry practice tomorrow. Patient reported trying to walk 2 miles per day. Patient noted in sleep diary a negative mood on days patient felt groggy. Patient noted on sleep reported using computer/tablet/phone prior to going to sleep. Patient reported patient has advised patient's employer patient no longer wants to work at night. Patient reported patient is open  to reading at night versus using an electronic device. Patient reported patient has been trying to increase patient's steps each day. Patient reported using an app for progressive muscle relaxation and has used meditation to fall asleep. Patient stated, I would like to check my machine (CPAP) and my heart rate prior to utilizing CBTI. Patient reported listening to sermons promote sleep. Patient stated, I feel good about it in reference to strategies to promote sleep.    Interventions: Cognitive Behavioral Therapy. Clinician conducted session in person at clinician's office at [facility]. Reviewed events since last session and assessed for changes. Discussed upcoming appointment with psychiatry practice. Discussed difficulty staying asleep. Reviewed patient's sleep diary. Provided psycho education related to healthy sleep hygiene and strategies to improve sleep hygiene, such as, decrease in screen time prior to sleep, getting out of bed after 20 minutes, reading, deep breathing. Provided psycho education related to deep breathing exercise, imagery, and progressive muscle relaxation. Discussed CBTI.    Collaboration of Care: not required at this time   Diagnosis:Adjustment disorder with mixed anxiety and depressed mood   Trauma and stressor-related disorder   Plan: Patient is to utilize [employer] Therapy, relaxation techniques, mindfulness, behavioral activation, and coping strategies to decrease symptoms associated with their diagnosis. Frequency: bi-weekly  Modality: individual      Long-term goal:   Reduce overall level, frequency, and intensity of the feelings of depression, anxiety and panic as evidenced by decrease in depressed mood, worry, anxiety,  difficulty staying asleep and returning to sleep, decreased concentration, decreased motivation, decreased energy from 7 days/week to 0 days/week per patient report for at least 3 consecutive  months. Target Date: 04/25/25   Progress: progressing    [place]-term goal:  decrease symptoms of anxiety from 3 days per week to 0 days per week by developing and implementing healthy coping mechanisms Target Date: 04/25/25  Progress: progressing    Increase patient's participation in social opportunities from 2 times per week to 5 times per week  Target Date: 04/25/25  Progress: progressing    Develop a plan for patient's care in the future Target Date: 04/25/25  Progress: progressing     Darice Seats, LCSW

## 2024-05-28 ENCOUNTER — Other Ambulatory Visit: Payer: Self-pay

## 2024-05-28 ENCOUNTER — Telehealth: Admitting: Psychiatry

## 2024-05-28 ENCOUNTER — Other Ambulatory Visit (HOSPITAL_COMMUNITY): Payer: Self-pay

## 2024-05-28 DIAGNOSIS — F5101 Primary insomnia: Secondary | ICD-10-CM

## 2024-05-28 DIAGNOSIS — F410 Panic disorder [episodic paroxysmal anxiety] without agoraphobia: Secondary | ICD-10-CM | POA: Diagnosis not present

## 2024-05-28 MED ORDER — FLUOXETINE HCL 20 MG PO CAPS: 20.0000 mg | ORAL_CAPSULE | Freq: Every day | ORAL | 0 refills | Status: AC

## 2024-05-28 NOTE — Progress Notes (Signed)
 Psychiatric Initial Adult Assessment   Patient Identification: Cheryl Dunlap MRN:  [id number] Date of Evaluation:  05/28/2024 Referral Source: Edsel Pepper PA-C Chief Complaint:  New patient visit.   Virtual Visit via Video Note  I connected with Cheryl Dunlap on 05/28/24 at 10:00 AM EDT by a video enabled telemedicine application and verified that I am speaking with the correct person using two identifiers.  Location: Patient: [address]  [place]heryl Dunlap [id number]  Provider: [facility]   I discussed the limitations of evaluation and management by telemedicine and the availability of in person appointments. The patient expressed understanding and agreed to proceed.    I discussed the assessment and treatment plan with the patient. The patient was provided an opportunity to ask questions and all were answered. The patient agreed with the plan and demonstrated an understanding of the instructions.   The patient was advised to call back or seek an in-person evaluation if the symptoms worsen or if the condition fails to improve as anticipated.  I provided 60 minutes of non-face-to-face time during this encounter.   Dorn Jama Der, NP   Visit Diagnosis:    ICD-10-CM   1. 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 Panic attacks  F41.0 FLUoxetine  (PROZAC ) 20 MG capsule      History of Present Illness:  68 year old female presenting to [facility] for establishing care. 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 Pt reports that she presents reporting issues with sleep stating that she is able to fall asleep, but she wakes up wide awake around 4 hours later and unable to fall back asleep. She has no other complaints at this time and reports her PCP did provide a trazadone prescription but due to her heart rate, in which she is under cardiology, states she does not feel comfortable. Pt was asked about supplements, in which she does not take any for sleep, and doesn't care for melatonin.   Pt was recommended magnesium glycinate.  Pt with no other concerns or questions. Pt is in agreement with treatment plan. Pt denies SI/HI/AVH.  Pt to followup in 2 weeks.    Associated Signs/Symptoms: Depression Symptoms:  insomnia, (Hypo) Manic Symptoms:  Negative Anxiety Symptoms:  Negative Psychotic Symptoms:  Negative PTSD Symptoms: Negative  Past Psychiatric History:  Previous Psych Hospitalizations: -Denies Outpatient treatment:  - Current outpatient is Cheryl Carter PA-C Medications Current: -Fluoxetine  20mg  once daily -Recommended magnesium glycinate.  Next Steps: -Refer client for a sleep study Medication Trials: -Trazadone, prescribed but reports she will not take due to heart rate Suicide & Violence: -Denies Substance Use: -Denies Psychotherapy: -Currently engaged in therapy Legal:  -Denies Previous Psychotropic Medications: No   Substance Abuse History in the last 12 months:  No.  Consequences of Substance Abuse: Negative  Past Medical History:  Past Medical History:  Diagnosis Date   Constipation    Coronary artery disease    carotid dissection   Depression    Hyperlipidemia     Past Surgical History:  Procedure Laterality Date   BREAST BIOPSY Right 10 plus yrs   benign   COLONOSCOPY     COLONOSCOPY WITH PROPOFOL  N/A 09/22/2020   Procedure: COLONOSCOPY WITH PROPOFOL ;  Surgeon: Toledo, Ladell POUR, MD;  Location: [facility] ENDOSCOPY;  Service: Gastroenterology;  Laterality: N/A;    Family Psychiatric History: No additional  Family History:  Family History  Problem Relation Age of Onset   Breast cancer Mother  pt not sure of age   Breast cancer Maternal Grandmother        pt not sure of age    Social History:   Social History   Socioeconomic History   Marital status: Single    Spouse name: Not on file   Number of children: Not on file   Years of education: Not on file   Highest education level: Not on file  Occupational History    Not on file  Tobacco Use   Smoking status: Never   Smokeless tobacco: Never  Substance and Sexual Activity   Alcohol use: Never   Drug use: Never   Sexual activity: Not on file  Other Topics Concern   Not on file  Social History Narrative   Not on file   Social Drivers of Health   Financial Resource Strain: Low Risk  (04/23/2024)   Received from [facility]   Overall Financial Resource Strain (CARDIA)    Difficulty of Paying Living Expenses: Not hard at all  Food Insecurity: No Food Insecurity (04/23/2024)   Received from [facility]   Hunger Vital Sign    Within the past 12 months, you worried that your food would run out before you got the money to buy more.: Never true    Within the past 12 months, the food you bought just didn't last and you didn't have money to get more.: Never true  Transportation Needs: No Transportation Needs (04/23/2024)   Received from [facility] - Transportation    In the past 12 months, has lack of transportation kept you from medical appointments or from getting medications?: No    Lack of Transportation (Non-Medical): No  Physical Activity: Not on file  Stress: Not on file  Social Connections: Not on file    Additional Social History: No additional  Allergies:   Allergies  Allergen Reactions   Eryc [Erythromycin] Hives   Lincomycin Nausea And Vomiting   Sulfa Antibiotics Hives    Metabolic Disorder Labs: No results found for: HGBA1C, MPG No results found for: PROLACTIN No results found for: CHOL, TRIG, HDL, CHOLHDL, VLDL, LDLCALC Lab Results  Component Value Date   TSH 3.088 03/13/2024    Therapeutic Level Labs: No results found for: LITHIUM No results found for: CBMZ No results found for: VALPROATE  Current Medications: Current Outpatient Medications  Medication Sig Dispense Refill   aspirin EC 81 MG tablet Take 81 mg by mouth at bedtime.   (Patient not taking: Reported on 09/22/2020)     Bioflavonoid Products (GRAPE SEED PO) Take 1 capsule by mouth at bedtime.     glucosamine-chondroitin 500-400 MG tablet Take 1 tablet by mouth at bedtime.     Multiple Vitamins-Minerals (ONE-A-DAY WOMENS 50+ ADVANTAGE) TABS Take 1 tablet by mouth daily.     Omega-3 Fatty Acids (FISH OIL PO) Take 1 capsule by mouth daily.     ondansetron  (ZOFRAN -ODT) 4 MG disintegrating tablet Take 1 tablet (4 mg total) by mouth every 8 (eight) hours as needed for nausea or vomiting. 20 tablet 0   Probiotic Product (ALIGN PO) Take 1 capsule by mouth daily.     saccharomyces boulardii (FLORASTOR) 250 MG capsule Take 250 mg by mouth daily.     simvastatin (ZOCOR) 10 MG tablet Take 15 mg by mouth at bedtime.     UBIQUINOL PO Take 1 capsule by mouth daily.     No current facility-administered medications for this  visit.    Musculoskeletal: Strength & Muscle Tone: within normal limits Gait & Station: normal Patient leans: N/A  Psychiatric Specialty Exam: Review of Systems  Constitutional: Negative.   HENT: Negative.    Eyes: Negative.   Respiratory: Negative.    Cardiovascular: Negative.   Gastrointestinal: Negative.   Endocrine: Negative.   Genitourinary: Negative.   Musculoskeletal: Negative.   Skin: Negative.   Allergic/Immunologic: Negative.   Neurological: Negative.   Hematological: Negative.   Psychiatric/Behavioral:  Positive for sleep disturbance.     There were no vitals taken for this visit.There is no height or weight on file to calculate BMI.  General Appearance: Well Groomed  Eye Contact:  Good  Speech:  Clear and Coherent  Volume:  Normal  Mood:  Euthymic  Affect:  Appropriate  Thought Process:  Coherent  Orientation:  Full (Time, Place, and Person)  Thought Content:  Logical  Suicidal Thoughts:  No  Homicidal Thoughts:  No  Memory:  Immediate;   Good Recent;   Good Remote;   Good  Judgement:  Good  Insight:  Good   Psychomotor Activity:  Normal  Concentration:  Concentration: Good and Attention Span: Good  Recall:  Good  Fund of Knowledge:Good  Language: Good  Akathisia:  No  Handed:  Right  AIMS (if indicated):  not done  Assets:  Desire for Improvement Financial Resources/Insurance Housing  ADL's:  Intact  Cognition: WNL  Sleep:  Good   Screenings: Flowsheet Row ED from 02/29/2024 in [facility] Emergency Department at [facility] ED from 02/24/2024 in [facility] Emergency Department at [facility] ED from 11/28/2023 in [facility] Emergency Department at [facility]  C-SSRS RISK CATEGORY No Risk No Risk No Risk    Assessment and Plan:  Assessment - Diagnosis: Primary insomnia [F51.01]  2. Panic attacks [F41.0]   - Progress: Baseline appt - Risk Factors: Worsening symptoms   Plan - Medications:  Increase prozac  20mg  once daily, for panic attacks Recommended to start magnesium glycinate.  - Psychotherapy: Continue therapy with current therapist.  - Education: Pt has been educated on how to reach this provider or call the clinic. Pt has been educated on the medication, side effects, adverse reactions. - Follow-Up: Pt will follow up in 3 weeks.  - Referrals: Pt referred to sleep study.  - Safety Planning:  The patient has been educated, if they should have suicidal thoughts with or without a plan to call 911, or go to the closest emergency department.  Pt verbalized understanding.  Pt denies firearms within the home.  Pt also agrees to call the clinic should they have worsening symptoms before the next appointment.    Patient/Guardian was advised Release of Information must be obtained prior to any record release in order to collaborate their care with an outside provider. Patient/Guardian was advised if they have not already done so to contact the registration department to sign all necessary forms in order for us  to release information regarding their care.   Consent:  Patient/Guardian gives verbal consent for treatment and assignment of benefits for services provided during this visit. Patient/Guardian expressed understanding and agreed to proceed.   Dorn Jama Der, NP 9/3/202510:01 AM

## 2024-06-10 ENCOUNTER — Ambulatory Visit (INDEPENDENT_AMBULATORY_CARE_PROVIDER_SITE_OTHER): Admitting: Clinical

## 2024-06-10 DIAGNOSIS — F439 Reaction to severe stress, unspecified: Secondary | ICD-10-CM | POA: Diagnosis not present

## 2024-06-10 DIAGNOSIS — F4323 Adjustment disorder with mixed anxiety and depressed mood: Secondary | ICD-10-CM

## 2024-06-10 NOTE — Progress Notes (Signed)
 [facility] Counselor/Therapist Progress Note  Patient ID: Cheryl Dunlap, MRN: [id number],    Date: 06/10/2024  Time Spent: 10:35am - 11:29am : 54 minutes   Treatment Type: Individual Therapy  Reported Symptoms: difficulty sleeping, anxiety  Mental Status Exam: Appearance:  Neat and Well Groomed     Behavior: Appropriate  Motor: Normal  Speech/Language:  Clear and Coherent and Normal Rate  Affect: Appropriate  Mood: normal  Thought process: normal  Thought content:   WNL  Sensory/Perceptual disturbances:   WNL  Orientation: oriented to person, place, time/date, and situation  Attention: Good  Concentration: Good  Memory: WNL  Fund of knowledge:  Good  Insight:   Good  Judgment:  Good  Impulse Control: Good   Risk Assessment: Danger to Self:  No Patient denied current suicidal ideation  Self-injurious Behavior: No Danger to Others: No Patient denied current homicidal ideation Duty to Warn:no Physical Aggression / Violence:No  Access to Firearms a concern: No  Gang Involvement:No   Subjective: Patient stated, pretty good in response to events since last session. Patient stated, I saw the nurse practitioner in reference to recent medication management appointment. Patient reported nurse practitioner recommended patient take magnesium glycinate for sleep and recommended a sleep study. Patient reported nurse practitioner recommended patient take 20 mg Prozac  in the morning. Patient reported its pretty much the same, I may not be awake as long at night in response to patient's sleep since last session. Patient stated, I still have good nights and bad nights. Patient reported utilizing virtual campfires are a distraction when experiencing anxiety. Patient reported feelings of panic at night. Patient stated, its better than it was in response to mood since last session. Patient stated, not bad at all in response to mood today. Patient stated,  it feels like  something isn't right in my body at night, I just try to figure out what it might be in reference to anxiety. Patient stated, I just try to find something to think about something else in reference to coping with symptoms of anxiety. Patient reported symptoms of anxiety decrease when patient is distracted. Patient reported a history of experiencing anxiety in college and reported difficulty falling asleep in college. Patient reported a history of experiencing anxiety while working in [place]  and difficulty falling asleep. Patient reported patient's health, being alone, not being able to take care of myself are triggers for anxiety. Patient reported patient has started attending classes at the [facility].   Interventions: Cognitive Behavioral Therapy. Clinician conducted session in person at clinician's office at [facility]. Reviewed events since last session and assessed for changes. Discussed recent appointment with psychiatric provider and recommendations. Explored changes in sleep since last session. Explored patient's previous experiences related to anxiety. Provided psycho education related to anxiety. Explored and identified triggers for anxiety. Discussed current opportunities for socialization.    Collaboration of Care: not required at this time   Diagnosis:Adjustment disorder with mixed anxiety and depressed mood   Trauma and stressor-related disorder   Plan: Patient is to utilize [employer] [facility], relaxation techniques, mindfulness, behavioral activation, and coping strategies to decrease symptoms associated with their diagnosis. Frequency: bi-weekly  Modality: individual      Long-term goal:   Reduce overall level, frequency, and intensity of the feelings of depression, anxiety and panic as evidenced by decrease in depressed mood, worry, anxiety,  difficulty staying asleep and returning to sleep, decreased concentration, decreased motivation, decreased energy from 7  days/week to 0 days/week per  patient report for at least 3 consecutive months. Target Date: 04/25/25  Progress: progressing    Paolo-term goal:  decrease symptoms of anxiety from 3 days per week to 0 days per week by developing and implementing healthy coping mechanisms Target Date: 04/25/25  Progress: progressing    Increase patient's participation in social opportunities from 2 times per week to 5 times per week  Target Date: 04/25/25  Progress: progressing    Develop a plan for patient's care in the future Target Date: 04/25/25  Progress: progressing    Darice Seats, LCSW

## 2024-06-10 NOTE — Progress Notes (Signed)
   Darice Seats, LCSW

## 2024-06-18 ENCOUNTER — Other Ambulatory Visit: Payer: Self-pay

## 2024-06-18 ENCOUNTER — Encounter: Payer: Self-pay | Admitting: Psychiatry

## 2024-06-18 ENCOUNTER — Ambulatory Visit (INDEPENDENT_AMBULATORY_CARE_PROVIDER_SITE_OTHER): Admitting: Psychiatry

## 2024-06-18 VITALS — BP 132/78 | HR 69 | Temp 96.3°F | Ht 66.0 in | Wt 183.0 lb

## 2024-06-18 DIAGNOSIS — F4323 Adjustment disorder with mixed anxiety and depressed mood: Secondary | ICD-10-CM

## 2024-06-18 DIAGNOSIS — F439 Reaction to severe stress, unspecified: Secondary | ICD-10-CM

## 2024-06-18 DIAGNOSIS — F5101 Primary insomnia: Secondary | ICD-10-CM

## 2024-06-18 NOTE — Progress Notes (Signed)
 BH MD/PA/NP OP Progress Note  06/18/2024 9:35 AM Cheryl Dunlap  MRN:  [id number]  Chief Complaint:  Chief Complaint  Patient presents with   Follow-up   HPI: 68 year old female presenting [facility] for follow-up.  Patient reports that she had an incident in which she magnesium glycinate in which she did not realize she was taking so much magnesium and became heavily dizzy 1 day and stopped the supplement recommendation.  Patient has also stated that ever since taking starting fluoxetine  she stayed on 10 mg and states that she did find it effective and remained on 10 mg which this provider states is appropriate.  Patient reports that she is optimistic about this coming up month as she is taking along Trip with her fleeta in which she has outfitted her fleeta to be able to go out and do a trip at a conference and come back.  Patient reports that she has a crown that she needs to be placed in her mouth as well to that she is concerned about.  Patient reports that she is also having concerns about her sleep apnea machine stating that she is not sure how effective it is.  Based on this information patient has been referred to [facility] resources to be able to get a sleep study done to see the effectiveness of her current CPAP machine.  Based on this assessment interview is recommended for the patient remain on 10 mg of fluoxetine  due to reports of her improved sleep and has been referred to [facility] resources to be able to help in evaluating her sleep patterns.  Patient is in agreement with treatment plan.  Patient denies SI, HI, AVH.  Patient with no other questions or concerns. Visit Diagnosis:    ICD-10-CM   1. Adjustment disorder with mixed anxiety and depressed mood  F43.23     2. Trauma and stressor-related disorder  F43.9     3. Primary insomnia  F51.01       Past Psychiatric History:  Previous Psych Hospitalizations: -Denies Outpatient treatment:  - Current outpatient is Danielle Carter PA-C Medications  Current: -Fluoxetine  20mg  once daily -Recommended magnesium glycinate.  Next Steps: -Refer client for a sleep study Medication Trials: -Trazadone, prescribed but reports she will not take due to heart rate Suicide & Violence: -Denies Substance Use: -Denies Psychotherapy: -Currently engaged in therapy Legal:  -Denies  Past Medical History:  Past Medical History:  Diagnosis Date   Constipation    Coronary artery disease    carotid dissection   Depression    Hyperlipidemia     Past Surgical History:  Procedure Laterality Date   BREAST BIOPSY Right 10 plus yrs   benign   COLONOSCOPY     COLONOSCOPY WITH PROPOFOL  N/A 09/22/2020   Procedure: COLONOSCOPY WITH PROPOFOL ;  Surgeon: Toledo, Ladell POUR, MD;  Location: [facility] ENDOSCOPY;  Service: Gastroenterology;  Laterality: N/A;    Family Psychiatric History: No additionaL  Family History:  Family History  Problem Relation Age of Onset   Breast cancer Mother        pt not sure of age   Breast cancer Maternal Grandmother        pt not sure of age    Social History:  Social History   Socioeconomic History   Marital status: Single    Spouse name: Not on file   Number of children: Not on file   Years of education: Not on file   Highest education level: Not on file  Occupational History  Not on file  Tobacco Use   Smoking status: Never   Smokeless tobacco: Never  Substance and Sexual Activity   Alcohol use: Never   Drug use: Never   Sexual activity: Not on file  Other Topics Concern   Not on file  Social History Narrative   Not on file   Social Drivers of Health   Financial Resource Strain: Low Risk  (04/23/2024)   Received from [facility]   Overall Financial Resource Strain (CARDIA)    Difficulty of Paying Living Expenses: Not hard at all  Food Insecurity: No Food Insecurity (04/23/2024)   Received from [facility]   Hunger Vital Sign    Within the past 12 months, you  worried that your food would run out before you got the money to buy more.: Never true    Within the past 12 months, the food you bought just didn't last and you didn't have money to get more.: Never true  Transportation Needs: No Transportation Needs (04/23/2024)   Received from [facility] - Transportation    In the past 12 months, has lack of transportation kept you from medical appointments or from getting medications?: No    Lack of Transportation (Non-Medical): No  Physical Activity: Not on file  Stress: Not on file  Social Connections: Not on file    Allergies:  Allergies  Allergen Reactions   Eryc [Erythromycin] Hives   Lincomycin Nausea And Vomiting   Sulfa Antibiotics Hives    Metabolic Disorder Labs: No results found for: HGBA1C, MPG No results found for: PROLACTIN No results found for: CHOL, TRIG, HDL, CHOLHDL, VLDL, LDLCALC Lab Results  Component Value Date   TSH 3.088 03/13/2024    Therapeutic Level Labs: No results found for: LITHIUM No results found for: VALPROATE No results found for: CBMZ  Current Medications: Current Outpatient Medications  Medication Sig Dispense Refill   aspirin EC 81 MG tablet Take 81 mg by mouth at bedtime.  (Patient not taking: Reported on 09/22/2020)     Bioflavonoid Products (GRAPE SEED PO) Take 1 capsule by mouth at bedtime.     FLUoxetine  (PROZAC ) 20 MG capsule Take 1 capsule (20 mg total) by mouth daily. 90 capsule 0   glucosamine-chondroitin 500-400 MG tablet Take 1 tablet by mouth at bedtime.     Multiple Vitamins-Minerals (ONE-A-DAY WOMENS 50+ ADVANTAGE) TABS Take 1 tablet by mouth daily.     Omega-3 Fatty Acids (FISH OIL PO) Take 1 capsule by mouth daily.     ondansetron  (ZOFRAN -ODT) 4 MG disintegrating tablet Take 1 tablet (4 mg total) by mouth every 8 (eight) hours as needed for nausea or vomiting. 20 tablet 0   Probiotic Product (ALIGN PO) Take 1 capsule by mouth daily.      saccharomyces boulardii (FLORASTOR) 250 MG capsule Take 250 mg by mouth daily.     simvastatin (ZOCOR) 10 MG tablet Take 15 mg by mouth at bedtime.     UBIQUINOL PO Take 1 capsule by mouth daily.     No current facility-administered medications for this visit.     Musculoskeletal: Strength & Muscle Tone: within normal limits Gait & Station: normal Patient leans: N/A   Psychiatric Specialty Exam: Review of Systems  Constitutional: Negative.   HENT: Negative.    Eyes: Negative.   Respiratory: Negative.    Cardiovascular: Negative.   Gastrointestinal: Negative.   Endocrine: Negative.   Genitourinary: Negative.   Musculoskeletal: Negative.  Skin: Negative.   Allergic/Immunologic: Negative.   Neurological: Negative.   Hematological: Negative.   Psychiatric/Behavioral:  Positive for sleep disturbance.     Today's Vitals   06/18/24 0923  BP: 132/78  Pulse: 69  Temp: (!) 96.3 F (35.7 C)  TempSrc: Temporal  Weight: 183 lb (83 kg)  Height: 5' 6 (1.676 m)   Body mass index is 29.54 kg/m.   General Appearance: Well Groomed  Eye Contact:  Good  Speech:  Clear and Coherent  Volume:  Normal  Mood:  Euthymic  Affect:  Appropriate  Thought Process:  Coherent  Orientation:  Full (Time, Place, and Person)  Thought Content:  Logical  Suicidal Thoughts:  No  Homicidal Thoughts:  No  Memory:  Immediate;   Good Recent;   Good Remote;   Good  Judgement:  Good  Insight:  Good  Psychomotor Activity:  Normal  Concentration:  Concentration: Good and Attention Span: Good  Recall:  Good  Fund of Knowledge:Good  Language: Good  Akathisia:  No  Handed:  Right  AIMS (if indicated):  not done  Assets:  Desire for Improvement Financial Resources/Insurance Housing  ADL's:  Intact  Cognition: WNL  Sleep:  Good   Screenings: Flowsheet Row ED from 02/29/2024 in [facility] Department at [facility] ED from 02/24/2024 in [facility] Emergency Department at  [facility] ED from 11/28/2023 in [facility] Department at [facility]  C-SSRS RISK CATEGORY No Risk No Risk No Risk     Assessment and Plan:  Assessment - Diagnosis: Primary insomnia [F51.01]  2. Panic attacks [F41.0]   - Progress: Baseline appt - Risk Factors: Worsening symptoms   Plan - Medications:  Increase prozac  20mg  once daily, for panic attacks. - Psychotherapy: Continue therapy with current therapist.  - Education: Pt has been educated on how to reach this provider or call the clinic. Pt has been educated on the medication, side effects, adverse reactions. - Follow-Up: Pt will follow up in 3 weeks.  - Referrals: Pt referred to sleep study.  - Safety Planning:  The patient has been educated, if they should have suicidal thoughts with or without a plan to call 911, or go to the closest emergency department.  Pt verbalized understanding.  Pt denies firearms within the home.  Pt also agrees to call the clinic should they have worsening symptoms before the next appointment.    Patient/Guardian was advised Release of Information must be obtained prior to any record release in order to collaborate their care with an outside provider. Patient/Guardian was advised if they have not already done so to contact the registration department to sign all necessary forms in order for us  to release information regarding their care.   Consent: Patient/Guardian gives verbal consent for treatment and assignment of benefits for services provided during this visit. Patient/Guardian expressed understanding and agreed to proceed.    Dorn Jama Der, NP 06/18/2024, 9:35 AM

## 2024-06-23 ENCOUNTER — Telehealth: Payer: Self-pay

## 2024-06-23 NOTE — Telephone Encounter (Signed)
 Hey, keep this one in mind, the other providers may want to know about this that it can happen quickly and they connect to CBT-I therapists which is huge.

## 2024-06-23 NOTE — Telephone Encounter (Signed)
 Received fax stating that patients referral was received at [facility] Telemedicine and patient has been scheduled for 06-25-2024 at 2:00 pm

## 2024-06-24 ENCOUNTER — Ambulatory Visit (INDEPENDENT_AMBULATORY_CARE_PROVIDER_SITE_OTHER): Admitting: Clinical

## 2024-06-24 DIAGNOSIS — F4323 Adjustment disorder with mixed anxiety and depressed mood: Secondary | ICD-10-CM | POA: Diagnosis not present

## 2024-06-24 NOTE — Progress Notes (Signed)
 [facility] Counselor/Therapist Progress Note  Patient ID: Janai Maudlin, MRN: [id number],    Date: 06/24/2024  Time Spent: 1:34pm - 2:25pm : 51 minutes   Treatment Type: Individual Therapy  Reported Symptoms: Patient reported recent decrease in anxiety  Mental Status Exam: Appearance:  Neat and Well Groomed     Behavior: Appropriate  Motor: Normal  Speech/Language:  Clear and Coherent and Normal Rate  Affect: Appropriate  Mood: normal  Thought process: normal  Thought content:   WNL  Sensory/Perceptual disturbances:   WNL  Orientation: oriented to person, place, time/date, and situation  Attention: Good  Concentration: Good  Memory: WNL  Fund of knowledge:  Good  Insight:   Good  Judgment:  Good  Impulse Control: Good   Risk Assessment: Danger to Self:  No Patient denied current suicidal ideation  Self-injurious Behavior: No Danger to Others: No Patient denied current homicidal ideation Duty to Warn:no Physical Aggression / Violence:No  Access to Firearms a concern: No  Gang Involvement:No   Subjective:  Patient stated, not too bad in response to events since last session. Patient reported patient attended a follow up appointment with psychiatric provider and reported provider recommended patient discontinue Prozac . Patient reported patient is currently taking 10 mg of Prozac . Patient reported psychiatric provider referred patient to [facility] for sleep study. Patient reported patient is still experiencing difficulty staying asleep. Patient stated, I'm not right now in response to recommendation for CBTI. Patient reported psychiatric provider recommended patient take Prozac  in the morning and patient stated, its much better too in reference to sleep. Patient reported patient has stopped working at night and reported not working at night has made a difference in sleep. Patient stated, fairly good in response to mood since last session. Patient stated, every  now and then it will try to start up on me and it'll start in reference to anxiety. Patient reported patient uses distraction in response to anxiety and is excited about upcoming trip. Patient reported concerned regarding safety in patient's work environment and safety concerns regarding upcoming travel. Patient stated, too much going on to even think about it in reference to anxiety related to being alone while traveling. Patient stated, the anxiety was all the health stuff going on. Patient stated, its not high in reference to anxiety.   Interventions: Cognitive Behavioral Therapy. Clinician conducted session in person at clinician's office at [facility]. Reviewed events since last session and assessed for changes. Discussed follow up appointment with psychiatric provider and recommendations. Reviewed recommendation for CBTI. Explored improvement in sleep since last session and explored contributing factors to improvement in sleep. Discussed upcoming positive activities. Assessed intensity and frequency of anxiety. Explored patient's experience as it relates to anxiety while traveling. Discussed safety concerns related to patient's work environment and upcoming travel. Clinician requested for homework patient complete a thought record.     Collaboration of Care: not required at this time   Diagnosis:Adjustment disorder with mixed anxiety and depressed mood   Trauma and stressor-related disorder   Plan: Patient is to utilize [employer] Therapy, relaxation techniques, mindfulness, behavioral activation, and coping strategies to decrease symptoms associated with their diagnosis. Frequency: bi-weekly  Modality: individual      Long-term goal:   Reduce overall level, frequency, and intensity of the feelings of depression, anxiety and panic as evidenced by decrease in depressed mood, worry, anxiety,  difficulty staying asleep and returning to sleep, decreased concentration,  decreased motivation, decreased energy from 7 days/week to 0  days/week per patient report for at least 3 consecutive months. Target Date: 04/25/25  Progress: progressing    Hetz-term goal:  decrease symptoms of anxiety from 3 days per week to 0 days per week by developing and implementing healthy coping mechanisms Target Date: 04/25/25  Progress: progressing    Increase patient's participation in social opportunities from 2 times per week to 5 times per week  Target Date: 04/25/25  Progress: progressing    Develop a plan for patient's care in the future Target Date: 04/25/25  Progress: progressing     Darice Seats, LCSW

## 2024-06-24 NOTE — Progress Notes (Signed)
   Darice Seats, LCSW

## 2024-07-16 ENCOUNTER — Ambulatory Visit: Admitting: Clinical

## 2024-07-30 ENCOUNTER — Ambulatory Visit: Admitting: Clinical

## 2024-07-30 DIAGNOSIS — F439 Reaction to severe stress, unspecified: Secondary | ICD-10-CM

## 2024-07-30 DIAGNOSIS — F4323 Adjustment disorder with mixed anxiety and depressed mood: Secondary | ICD-10-CM | POA: Diagnosis not present

## 2024-07-30 NOTE — Progress Notes (Signed)
 [facility] Counselor/Therapist Progress Note  Patient ID: Cheryl Dunlap, MRN: [id number],    Date: 07/30/2024  Time Spent: 1:37pm - 2:31pm : 54 minutes   Treatment Type: Individual Therapy  Reported Symptoms: difficulty staying asleep,nightmare  Mental Status Exam: Appearance:  Neat and Well Groomed     Behavior: Appropriate  Motor: Normal  Speech/Language:  Clear and Coherent and Normal Rate  Affect: Appropriate  Mood: normal  Thought process: normal  Thought content:   WNL  Sensory/Perceptual disturbances:   WNL  Orientation: oriented to person, place, time/date, and situation  Attention: Good  Concentration: Good  Memory: WNL  Fund of knowledge:  Good  Insight:   Good  Judgment:  Good  Impulse Control: Good   Risk Assessment: Danger to Self:  No Patient denied current suicidal ideation  Self-injurious Behavior: No Danger to Others: No Patient denied current homicidal ideation Duty to Warn:no Physical Aggression / Violence:No  Access to Firearms a concern: No  Gang Involvement:No   Subjective: Patient stated, it was great in response to patient's recent trip. Patient reported patient continues to experience difficulty sleeping.  Patient reported I can think about doing it in response to CBTI. Patient reported improvement in sleep when sleeping in patient's van. Patient reported limited movement while sleeping in [place], wakes up feeling hot or cold at home, improvement in sleep when temperature is colder, cpap placement in [place] is closer, increased noise on the road. Patient stated,  I'm probably a little bit more anxious if I'm driving in the city. Patient reported patient does not taking naps when traveling. Patient reported experiencing a nightmare last night. 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 Patient stated, that's what I need to realize, its not me in reference to providing support at work. Patient stated, its seems  like that stuff doesn't hit me as bad but it comes back later in reference to thoughts associated with work related stressors. Patient reported patient feels thoughts are associated with PTSD. Patient reported feeling tired today. Patient reported patient did not complete thought record due to recent travel.   Interventions: Cognitive Behavioral Therapy. Clinician conducted session in person at clinician's office at [facility]. Reviewed events since last session and assessed for changes. Discussed patient's recent trip. Discussed difficulty sleeping. Reviewed recommendation for CBTI. Explored differences in patient's quality of sleep at home versus when traveling. Discussed strategies to increase sleep, such as, using sounds. Explored triggers for decline in sleep. Reviewed thought record and barriers to completing thought record. Clinician requested for homework patient complete a thought record.     Collaboration of Care: not required at this time   Diagnosis:Adjustment disorder with mixed anxiety and depressed mood   Trauma and stressor-related disorder   Plan: Patient is to utilize [employer] Therapy, relaxation techniques, mindfulness, behavioral activation, and coping strategies to decrease symptoms associated with their diagnosis. Frequency: bi-weekly  Modality: individual      Long-term goal:   Reduce overall level, frequency, and intensity of the feelings of depression, anxiety and panic as evidenced by decrease in depressed mood, worry, anxiety,  difficulty staying asleep and returning to sleep, decreased concentration, decreased motivation, decreased energy from 7 days/week to 0 days/week per patient report for at least 3 consecutive months. Target Date: 04/25/25  Progress: progressing    Dolley-term goal:  decrease symptoms of anxiety from 3 days per week to 0 days per week by developing and implementing healthy coping mechanisms Target Date: 04/25/25  Progress:  progressing    Increase patient's participation in social opportunities from 2 times per week to 5 times per week  Target Date: 04/25/25  Progress: progressing    Develop a plan for patient's care in the future Target Date: 04/25/25  Progress: progressing     Darice Seats, LCSW

## 2024-07-30 NOTE — Progress Notes (Signed)
   Darice Seats, LCSW

## 2024-07-31 NOTE — Telephone Encounter (Signed)
 She will have to contact her insurance or the [facility] health office. [facility] should be able to tell her if there is any out of pocket.  We do not have access into the billing side of referrals and out of pocket costs.

## 2024-08-05 ENCOUNTER — Ambulatory Visit (INDEPENDENT_AMBULATORY_CARE_PROVIDER_SITE_OTHER): Admitting: Psychiatry

## 2024-08-05 ENCOUNTER — Encounter: Payer: Self-pay | Admitting: Psychiatry

## 2024-08-05 VITALS — BP 112/66 | HR 66 | Temp 98.8°F

## 2024-08-05 DIAGNOSIS — F4323 Adjustment disorder with mixed anxiety and depressed mood: Secondary | ICD-10-CM

## 2024-08-05 DIAGNOSIS — F439 Reaction to severe stress, unspecified: Secondary | ICD-10-CM

## 2024-08-05 NOTE — Progress Notes (Signed)
 [facility] MD/PA/NP OP Progress Note  08/05/2024 9:46 AM Cheryl Dunlap  MRN:  [id number]  Chief Complaint: Routine Follow-up HPI: 67 year old female presenting [facility] for follow-up.  Patient reports that she had a bad experience with the [email or web address] experience stating that they wanted her to pay upfront in order to get an appointment right away.  Patient described that she would not be paying in which she felt she was forced by the agency to set up an appointment in which she was not able to get her sleep study done.  Patient reports she will very much like to get the sleep study done and on the last appointment was sent to [facility] sleep lab in which she was provided phone number today to follow-up.  Patient reports she is doing well states she just got back from a 3-week vacation on the road stating that she [place] and had a great time reporting that she is also planning another trip in December hopefully going to either [place]  or [place] .  Patient reports that all in all her fluoxetine  at 10 mg is doing well but states she would like to work on her sleep.  Patient has been recommended for her to continue waiting for referral from sleep study but [email or web address] will be contacted again to see what is going on regarding the referral.  Patient denies SI, HI, AVH.  Patient with no other question concerns at this time.  Patient is in agreement with treatment plan.  Patient with no follow-up at this time. Visit Diagnosis:    ICD-10-CM   1. Adjustment disorder with mixed anxiety and depressed mood  F43.23     2. Trauma and stressor-related disorder  F43.9       Past Psychiatric History:  Previous Psych Hospitalizations: -Denies Outpatient treatment:  - Current outpatient is Danielle Carter PA-C Medications Current: -Fluoxetine  20mg  once daily -Recommended magnesium glycinate.  Next Steps: -Refer client for a sleep study Medication Trials: -Trazadone, prescribed but reports she will not take due to heart  rate Suicide & Violence: -Denies Substance Use: -Denies Psychotherapy: -Currently engaged in therapy Legal:  -Denies  Past Medical History:  Past Medical History:  Diagnosis Date   Constipation    Coronary artery disease    carotid dissection   Depression    Hyperlipidemia     Past Surgical History:  Procedure Laterality Date   BREAST BIOPSY Right 10 plus yrs   benign   COLONOSCOPY     COLONOSCOPY WITH PROPOFOL  N/A 09/22/2020   Procedure: COLONOSCOPY WITH PROPOFOL ;  Surgeon: Toledo, Ladell POUR, MD;  Location: [facility] ENDOSCOPY;  Service: Gastroenterology;  Laterality: N/A;    Family Psychiatric History: No additional  Family History:  Family History  Problem Relation Age of Onset   Breast cancer Mother        pt not sure of age   Breast cancer Maternal Grandmother        pt not sure of age    Social History:  Social History   Socioeconomic History   Marital status: Single    Spouse name: Not on file   Number of children: Not on file   Years of education: Not on file   Highest education level: Not on file  Occupational History   Not on file  Tobacco Use   Smoking status: Never   Smokeless tobacco: Never  Substance and Sexual Activity   Alcohol use: Never   Drug use: Never   Sexual activity: Not on file  Other Topics Concern   Not on file  Social History Narrative   Not on file   Social Drivers of Health   Financial Resource Strain: Low Risk  (04/23/2024)   Received from [facility]   Overall Financial Resource Strain (CARDIA)    Difficulty of Paying Living Expenses: Not hard at all  Food Insecurity: No Food Insecurity (04/23/2024)   Received from [facility]   Hunger Vital Sign    Within the past 12 months, you worried that your food would run out before you got the money to buy more.: Never true    Within the past 12 months, the food you bought just didn't last and you didn't have money to get more.: Never true   Transportation Needs: No Transportation Needs (04/23/2024)   Received from [facility] - Transportation    In the past 12 months, has lack of transportation kept you from medical appointments or from getting medications?: No    Lack of Transportation (Non-Medical): No  Physical Activity: Not on file  Stress: Not on file  Social Connections: Not on file    Allergies:  Allergies  Allergen Reactions   Eryc [Erythromycin] Hives   Lincomycin Nausea And Vomiting   Sulfa Antibiotics Hives    Metabolic Disorder Labs: No results found for: HGBA1C, MPG No results found for: PROLACTIN No results found for: CHOL, TRIG, HDL, CHOLHDL, VLDL, LDLCALC Lab Results  Component Value Date   TSH 3.088 03/13/2024    Therapeutic Level Labs: No results found for: LITHIUM No results found for: VALPROATE No results found for: CBMZ  Current Medications: Current Outpatient Medications  Medication Sig Dispense Refill   alendronate (FOSAMAX) 70 MG/75ML solution Take 70 mg by mouth every 7 (seven) days. Take with a full glass of water on an empty stomach.     aspirin EC 81 MG tablet Take 81 mg by mouth at bedtime.      FLUoxetine  (PROZAC ) 20 MG capsule Take 1 capsule (20 mg total) by mouth daily. (Patient taking differently: Take 10 mg by mouth daily.) 90 capsule 0   glucosamine-chondroitin 500-400 MG tablet Take 1 tablet by mouth at bedtime.     magnesium citrate SOLN Take 1 Bottle by mouth once.     Multiple Vitamins-Minerals (ONE-A-DAY WOMENS 50+ ADVANTAGE) TABS Take 1 tablet by mouth daily. (Patient taking differently: Take 1 tablet by mouth every other day.)     Omega-3 Fatty Acids (FISH OIL PO) Take 1 capsule by mouth daily.     Probiotic Product (ALIGN PO) Take 1 capsule by mouth daily.     saccharomyces boulardii (FLORASTOR) 250 MG capsule Take 250 mg by mouth daily.     simvastatin (ZOCOR) 10 MG tablet Take 15 mg by mouth at bedtime. (Patient  taking differently: Take 20 mg by mouth at bedtime.)     UBIQUINOL PO Take 1 capsule by mouth daily.     Vitamin D-Vitamin K (VITAMIN K2-VITAMIN D3) 90-125 MCG CAPS Take by mouth.     No current facility-administered medications for this visit.     Musculoskeletal: Strength & Muscle Tone: within normal limits Gait & Station: normal Patient leans: N/A   Psychiatric Specialty Exam: Review of Systems  Constitutional: Negative.   HENT: Negative.    Eyes: Negative.   Respiratory: Negative.    Cardiovascular: Negative.   Gastrointestinal: Negative.   Endocrine: Negative.   Genitourinary: Negative.   Musculoskeletal: Negative.   Skin:  Negative.   Allergic/Immunologic: Negative.   Neurological: Negative.   Hematological: Negative.   Psychiatric/Behavioral:  Positive for sleep disturbance.      Vitals:   08/05/24 0923  BP: 112/66  Pulse: 66  Temp: 98.8 F (37.1 C)  SpO2: 97%     General Appearance: Well Groomed  Eye Contact:  Good  Speech:  Clear and Coherent  Volume:  Normal  Mood:  Euthymic  Affect:  Appropriate  Thought Process:  Coherent  Orientation:  Full (Time, Place, and Person)  Thought Content:  Logical  Suicidal Thoughts:  No  Homicidal Thoughts:  No  Memory:  Immediate;   Good Recent;   Good Remote;   Good  Judgement:  Good  Insight:  Good  Psychomotor Activity:  Normal  Concentration:  Concentration: Good and Attention Span: Good  Recall:  Good  Fund of Knowledge:Good  Language: Good  Akathisia:  No  Handed:  Right  AIMS (if indicated):  not done  Assets:  Desire for Improvement Financial Resources/Insurance Housing  ADL's:  Intact  Cognition: WNL  Sleep:  Good   Screenings: GAD-7    Flowsheet Row Office Visit from 08/05/2024 in [facility]  Total GAD-7 Score 4   PHQ2-9    Flowsheet Row Office Visit from 08/05/2024 in [facility]  PHQ-2 Total Score 2   PHQ-9 Total Score 8   Flowsheet Row Office Visit from 08/05/2024 in [facility] from 02/29/2024 in [facility] Department at [facility] ED from 02/24/2024 in [facility] Emergency Department at [facility]  C-SSRS RISK CATEGORY No Risk No Risk No Risk     Assessment and Plan:  Assessment - Diagnosis: Primary insomnia [F51.01]  2. Panic attacks [F41.0]   - Progress: Baseline appt - Risk Factors: Worsening symptoms   Plan - Medications:  Increase prozac  20mg  once daily, for panic attacks. - Psychotherapy: Continue therapy with current therapist.  - Education: Pt has been educated on how to reach this provider or call the clinic. Pt has been educated on the medication, side effects, adverse reactions. - Follow-Up: Pt will follow up in 3 weeks.  - Referrals: Pt referred to sleep study.  - Safety Planning:  The patient has been educated, if they should have suicidal thoughts with or without a plan to call 911, or go to the closest emergency department.  Pt verbalized understanding.  Pt denies firearms within the home.  Pt also agrees to call the clinic should they have worsening symptoms before the next appointment.   Patient/Guardian was advised Release of Information must be obtained prior to any record release in order to collaborate their care with an outside provider. Patient/Guardian was advised if they have not already done so to contact the registration department to sign all necessary forms in order for us  to release information regarding their care.   Consent: Patient/Guardian gives verbal consent for treatment and assignment of benefits for services provided during this visit. Patient/Guardian expressed understanding and agreed to proceed.    Dorn Jama Der, NP 08/05/2024, 9:46 AM

## 2024-08-07 ENCOUNTER — Telehealth: Payer: Self-pay

## 2024-08-07 ENCOUNTER — Telehealth: Payer: Self-pay | Admitting: Psychiatry

## 2024-08-07 NOTE — Telephone Encounter (Signed)
Referral sent and patient informed

## 2024-08-07 NOTE — Telephone Encounter (Signed)
 Referral sent to [facility] at [phone] confirmation received and sent to scan informed patient of the referral sent

## 2024-08-11 ENCOUNTER — Ambulatory Visit (INDEPENDENT_AMBULATORY_CARE_PROVIDER_SITE_OTHER): Admitting: Clinical

## 2024-08-11 DIAGNOSIS — F439 Reaction to severe stress, unspecified: Secondary | ICD-10-CM

## 2024-08-11 DIAGNOSIS — F4323 Adjustment disorder with mixed anxiety and depressed mood: Secondary | ICD-10-CM | POA: Diagnosis not present

## 2024-08-11 NOTE — Progress Notes (Signed)
 [facility] Counselor/Therapist Progress Note  Patient ID: Cheryl Dunlap, MRN: [id number],    Date: 08/11/2024  Time Spent: 8:34am - 9:26am : 52 minutes   Treatment Type: Individual Therapy  Reported Symptoms: difficulty sleeping  Mental Status Exam: Appearance:  Neat and Well Groomed     Behavior: Appropriate  Motor: Normal  Speech/Language:  Clear and Coherent and Normal Rate  Affect: Appropriate  Mood: normal  Thought process: normal  Thought content:   WNL  Sensory/Perceptual disturbances:   WNL  Orientation: oriented to person, place, time/date, and situation  Attention: Good  Concentration: Good  Memory: WNL  Fund of knowledge:  Good  Insight:   Good  Judgment:  Good  Impulse Control: Good   Risk Assessment: Danger to Self:  No Patient denied current suicidal ideation  Self-injurious Behavior: No Danger to Others: No Patient denied current homicidal ideation Duty to Warn:no Physical Aggression / Violence:No  Access to Firearms a concern: No  Gang Involvement:No   Subjective: Patient reported a recent appointment with psychiatric NP and reported psychiatric NP sent a referral to a local sleep clinic. Patient reported patient is waiting for a response from the sleep clinic to schedule sleep study. Patient reported waking up experiencing chest pressure and  chest pain. Patient reported waking up at night due to a twitch in patient's extremities and reported it occasionally happens when I'm falling asleep. Patient stated, its just been so upsetting trying to get a sleep study done. Patient reported concern related to difficulty sleeping and laying in bed a long period of time. Patient reported practicing progressive muscle relaxation to return to sleep. Patient reported low heart rate and fear of heart rate lowering is a barrier to practicing relaxation techniques at night. Patient reported patient has an appointment with cardiologist this week. Patient  reported a recent decrease in anxiety and stated, moderately in reference to decrease. Patient reported sleeping in shifts as needed due to difficulty staying asleep. Patient stated, kind of in between in response to mood since last session.   Interventions: Cognitive Behavioral Therapy. Clinician conducted session via caregility video for the entirety of session and telephone for audio from 8:37am - 9:26am from clinician's home office due to the audio component of patient's caregility not working. Patient provided verbal consent to proceed with telehealth session and is aware of limitations of telephone or video visits. Patient participated in session from patient's home. Reviewed events since last session and assessed for changes. Discussed recent appointment with psychiatric NP and patient's concerns related to scheduling sleep study. Reviewed sleep diary. Discussed patient's implementation of progressive muscle relaxation. Explored barriers to practicing relaxation techniques. Discussed patient notifying physician of symptoms reported during session. Provided psycho education related to healthy sleep hygiene. Clinician requested for homework patient continue thought record.    Collaboration of Care: not required at this time   Diagnosis:Adjustment disorder with mixed anxiety and depressed mood   Trauma and stressor-related disorder   Plan: Patient is to utilize [employer] Therapy, relaxation techniques, mindfulness, behavioral activation, and coping strategies to decrease symptoms associated with their diagnosis. Frequency: bi-weekly  Modality: individual      Long-term goal:   Reduce overall level, frequency, and intensity of the feelings of depression, anxiety and panic as evidenced by decrease in depressed mood, worry, anxiety,  difficulty staying asleep and returning to sleep, decreased concentration, decreased motivation, decreased energy from 7 days/week to 0 days/week per  patient report for at least 3 consecutive months. Target  Date: 04/25/25  Progress: progressing    Adger-term goal:  decrease symptoms of anxiety from 3 days per week to 0 days per week by developing and implementing healthy coping mechanisms Target Date: 04/25/25  Progress: progressing    Increase patient's participation in social opportunities from 2 times per week to 5 times per week  Target Date: 04/25/25  Progress: progressing    Develop a plan for patient's care in the future Target Date: 04/25/25  Progress: progressing       Darice Seats, LCSW

## 2024-08-11 NOTE — Progress Notes (Signed)
   Cheryl Seats, LCSW

## 2024-09-01 ENCOUNTER — Ambulatory Visit: Admitting: Clinical

## 2024-09-01 DIAGNOSIS — F4323 Adjustment disorder with mixed anxiety and depressed mood: Secondary | ICD-10-CM

## 2024-09-01 DIAGNOSIS — F439 Reaction to severe stress, unspecified: Secondary | ICD-10-CM

## 2024-09-01 NOTE — Progress Notes (Signed)
 [facility] Counselor/Therapist Progress Note  Patient ID: Cheryl Dunlap, MRN: [id number],    Date: 09/01/2024  Time Spent: 11:36am - 12:21pm : 45 minutes   Treatment Type: Individual Therapy  Reported Symptoms: difficulty sleeping, anxiety  Mental Status Exam: Appearance:  Neat and Well Groomed     Behavior: Appropriate  Motor: Normal  Speech/Language:  Clear and Coherent and Normal Rate  Affect: Appropriate  Mood: normal  Thought process: normal  Thought content:   WNL  Sensory/Perceptual disturbances:   WNL  Orientation: oriented to person, place, time/date, and situation  Attention: Good  Concentration: Good  Memory: WNL  Fund of knowledge:  Good  Insight:   Good  Judgment:  Good  Impulse Control: Good   Risk Assessment: Danger to Self:  No Patient denied current suicidal ideation  Danger to Others: No Duty to Warn:no Patient denied current homicidal ideation Physical Aggression / Violence:No  Access to Firearms a concern: No  Gang Involvement:No   Subjective: Patient stated, not a whole lot has changed in response to events since last session and stated, everything's about the same as it was. Patient reported patient has not received a response in regard to scheduling a sleep study. Patient stated, I've kind of tested around working some and how its affecting my sleep, I kind of overdid it in reference to patient's work schedule. Patient reported a recent increase in work hours. Patient stated, It was too much and too stressful in reference to working two days consecutively. 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 Patient reported I seem to be able to keep on top of that in reference to mood. Patient stated, there's anxiety because I feel myself getting upset and patient reported asking herself what am I upset about in response. Patient stated,  I'm just too  responsible I guess, co-workers' behaviors, and choosing a class to attend at church are triggers for recent feelings of anxiety. Patient reported teacher's teaching style, stated they are welcoming in response to pros for one class. Patient stated, either one would be fine. Patient reported  they assume an awful lot is a con for one class and the temperature of the classroom is a con for another class. Patient reported the possibility of attending two additional classes to determine the class patient would like to attend consistently. At the completion of today's session, patient scheduled a follow up appointment in March 2026 due to upcoming travel plans.   Interventions: Cognitive Behavioral Therapy. Clinician conducted session via caregility video from clinician's home office. Patient provided verbal consent to proceed with telehealth session and is aware of limitations of telephone or video visits. Patient participated in session from patient's home. Reviewed events since last session and assessed for changes. Discussed status of sleep study. Discussed changes in patient's work schedule and the impact on patient's mood and sleep. Explored and identified recent triggers for anxiety. Assisted patient in exploring/identifying pros/cons related to choosing a class to attend.    Collaboration of Care: not required at this time   Diagnosis:Adjustment disorder with mixed anxiety and depressed mood   Trauma and stressor-related disorder   Plan: Patient is to utilize [employer] [facility], relaxation techniques, mindfulness, behavioral activation, and coping strategies to decrease symptoms associated with their diagnosis. Frequency: bi-weekly  Modality: individual      Long-term goal:   Reduce overall level, frequency, and intensity of the feelings of depression, anxiety and panic as evidenced  by decrease in depressed mood, worry, anxiety,  difficulty staying asleep and returning to sleep,  decreased concentration, decreased motivation, decreased energy from 7 days/week to 0 days/week per patient report for at least 3 consecutive months. Target Date: 04/25/25  Progress: progressing    [place]-term goal:  decrease symptoms of anxiety from 3 days per week to 0 days per week by developing and implementing healthy coping mechanisms Target Date: 04/25/25  Progress: progressing    Increase patient's participation in social opportunities from 2 times per week to 5 times per week  Target Date: 04/25/25  Progress: progressing    Develop a plan for patient's care in the future Target Date: 04/25/25  Progress: progressing     Darice Seats, LCSW

## 2024-09-01 NOTE — Progress Notes (Signed)
   Darice Seats, LCSW

## 2024-12-10 ENCOUNTER — Ambulatory Visit: Admitting: Clinical

## 2024-12-25 ENCOUNTER — Ambulatory Visit: Admitting: Clinical
